# Patient Record
Sex: Female | Born: 1990 | Hispanic: Yes | Marital: Married | Smoking: Never smoker
Health system: Southern US, Community
[De-identification: ages and names within clinical notes are randomized; demographics above are authoritative.]

## PROBLEM LIST (undated history)

## (undated) ENCOUNTER — Inpatient Hospital Stay (HOSPITAL_COMMUNITY): Payer: Self-pay

## (undated) DIAGNOSIS — Z8669 Personal history of other diseases of the nervous system and sense organs: Secondary | ICD-10-CM

## (undated) DIAGNOSIS — R569 Unspecified convulsions: Secondary | ICD-10-CM

## (undated) DIAGNOSIS — K200 Eosinophilic esophagitis: Secondary | ICD-10-CM

## (undated) DIAGNOSIS — IMO0002 Reserved for concepts with insufficient information to code with codable children: Secondary | ICD-10-CM

## (undated) DIAGNOSIS — T7840XA Allergy, unspecified, initial encounter: Secondary | ICD-10-CM

## (undated) DIAGNOSIS — F419 Anxiety disorder, unspecified: Secondary | ICD-10-CM

## (undated) DIAGNOSIS — N3010 Interstitial cystitis (chronic) without hematuria: Secondary | ICD-10-CM

## (undated) DIAGNOSIS — D509 Iron deficiency anemia, unspecified: Secondary | ICD-10-CM

## (undated) DIAGNOSIS — N189 Chronic kidney disease, unspecified: Secondary | ICD-10-CM

## (undated) HISTORY — DX: Reserved for concepts with insufficient information to code with codable children: IMO0002

## (undated) HISTORY — DX: Anxiety disorder, unspecified: F41.9

## (undated) HISTORY — DX: Allergy, unspecified, initial encounter: T78.40XA

## (undated) HISTORY — DX: Eosinophilic esophagitis: K20.0

## (undated) HISTORY — PX: CYSTOSCOPY W/ DILATION OF BLADDER: SUR374

## (undated) HISTORY — PX: OTHER SURGICAL HISTORY: SHX169

## (undated) HISTORY — DX: Personal history of other diseases of the nervous system and sense organs: Z86.69

## (undated) HISTORY — DX: Iron deficiency anemia, unspecified: D50.9

## (undated) HISTORY — DX: Interstitial cystitis (chronic) without hematuria: N30.10

---
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
## 2009-03-10 ENCOUNTER — Telehealth: Payer: Self-pay | Admitting: Family Medicine

## 2009-03-10 IMAGING — US US OB DETAIL+14 WK
1 series · 14 of 28 positions shown · non-contrast
Comparison: none

OBSTETRICAL ULTRASOUND:
 This ultrasound exam was performed in the [HOSPITAL] Ultrasound Department.  The OB US report was generated in the AS system, and faxed to the ordering physician.  This report is also available in [REDACTED] PACS.

[Series 1: us ob detail+14 wk · 0.17mm/px · 14 of 85 slices shown]
[im 4/85]
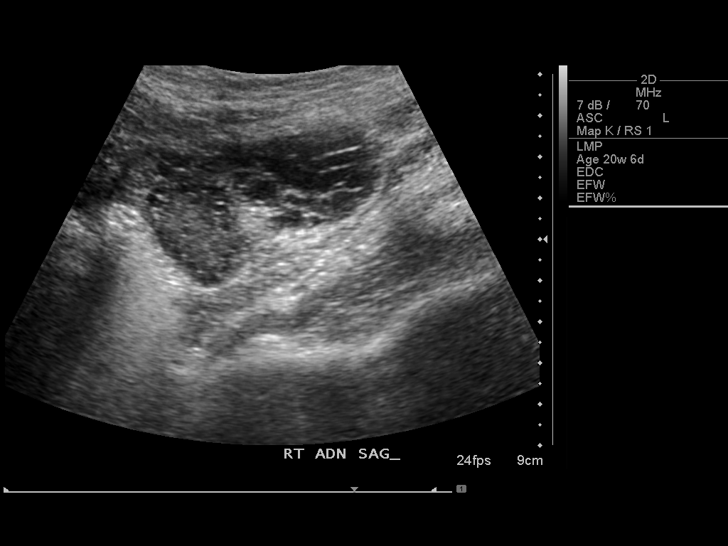
[im 10/85]
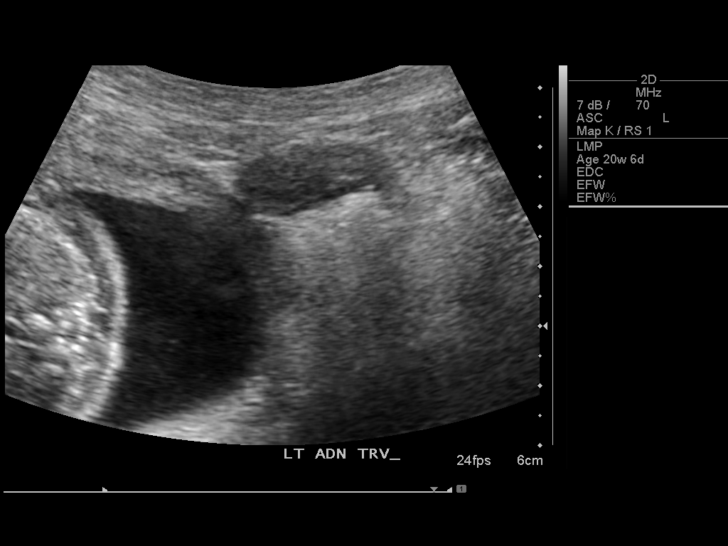
[im 16/85]
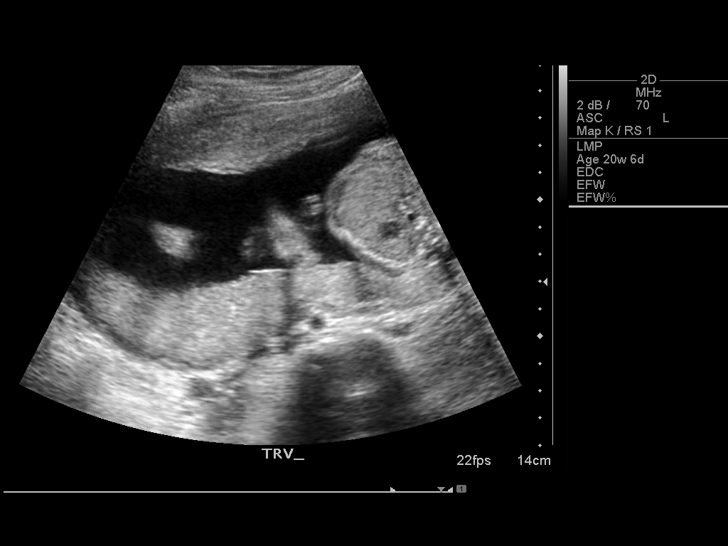
[im 22/85]
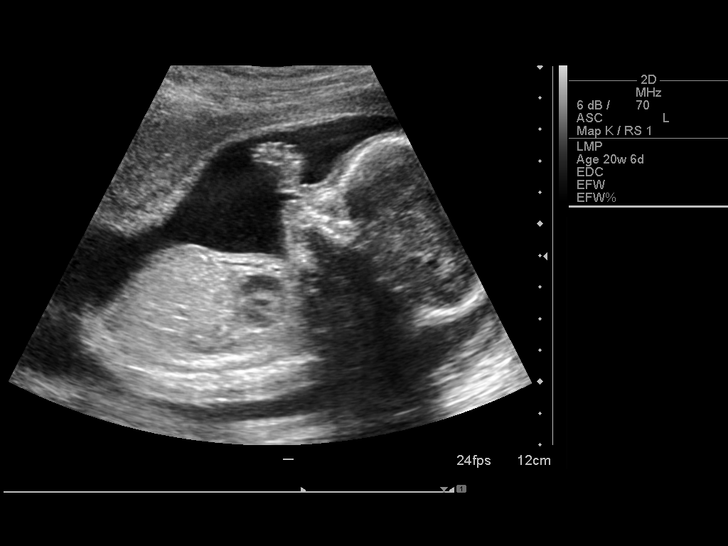
[im 29/85]
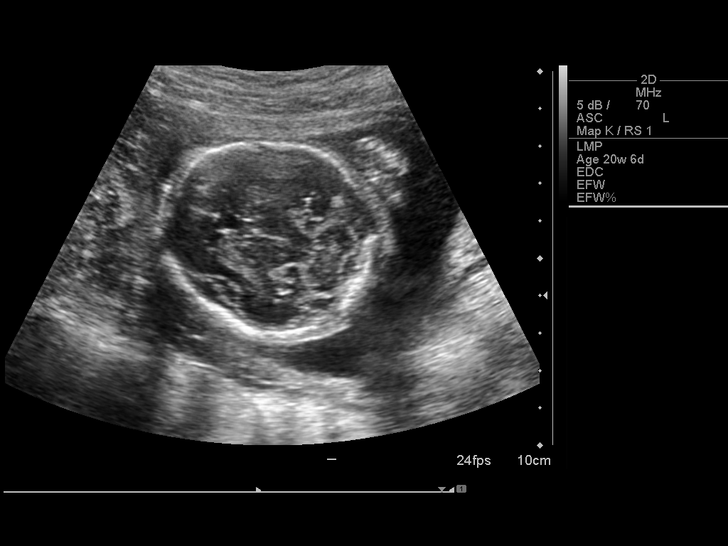
[im 35/85]
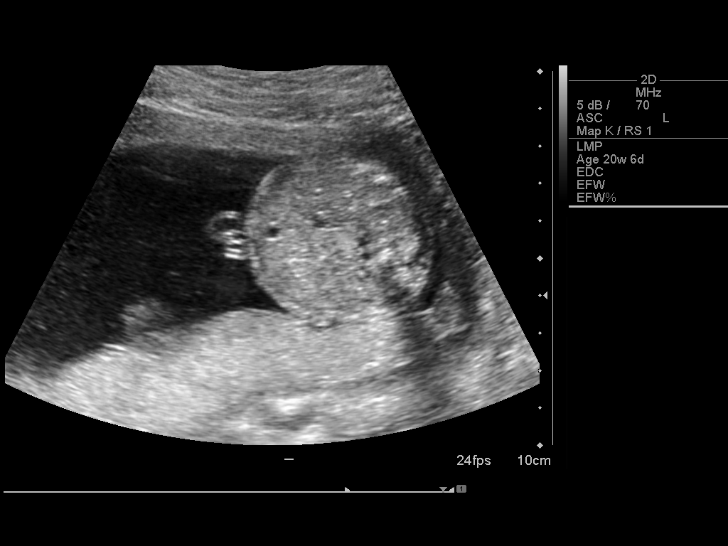
[im 41/85]
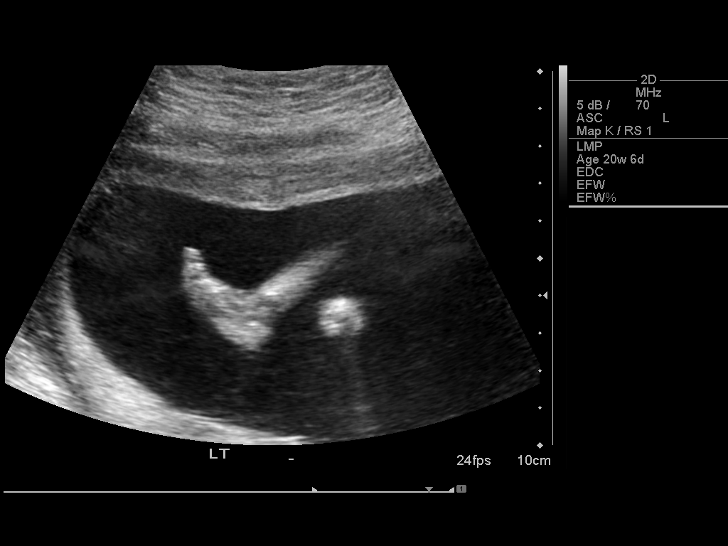
[im 47/85]
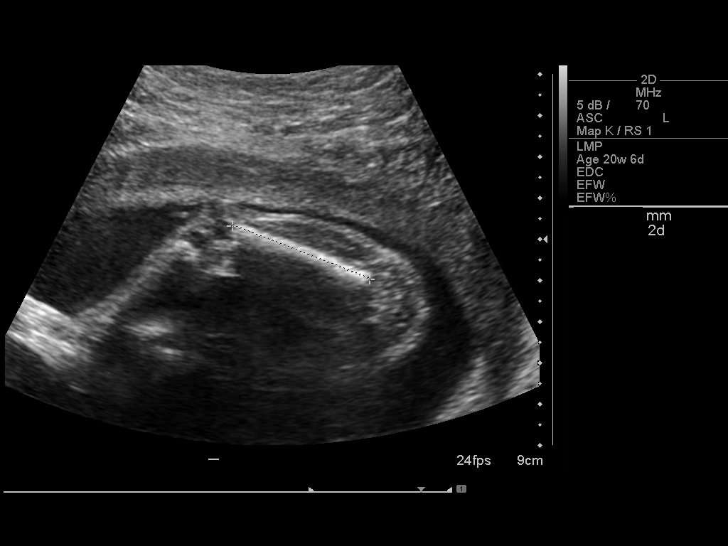
[im 53/85]
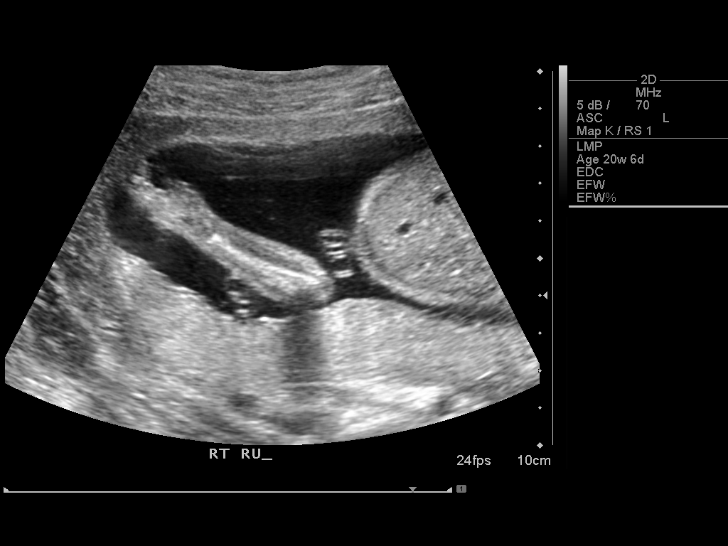
[im 60/85]
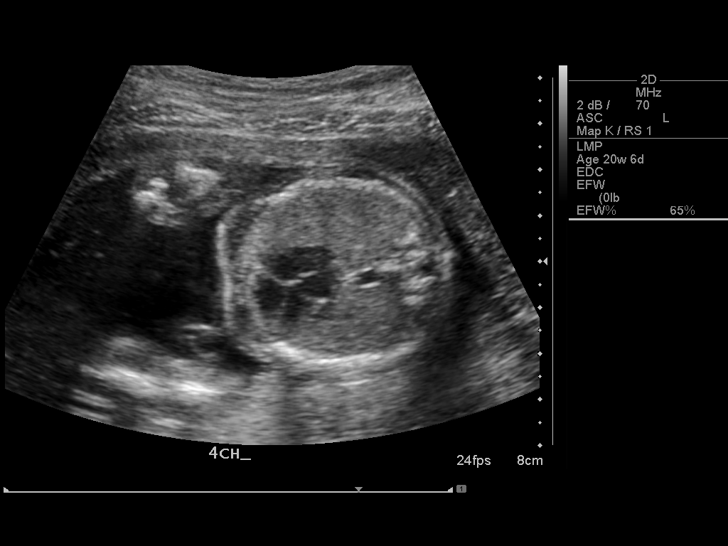
[im 66/85]
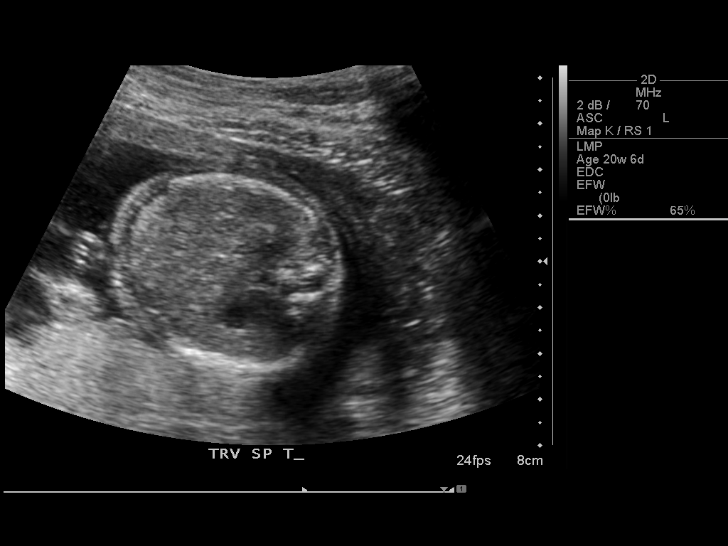
[im 72/85]
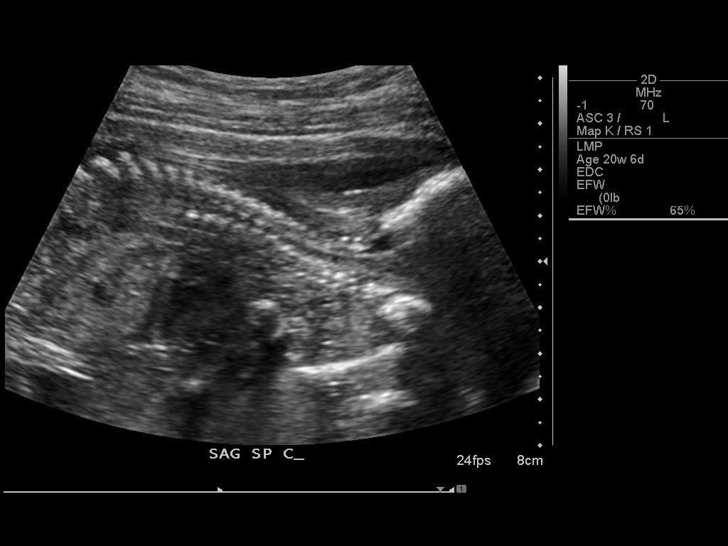
[im 78/85]
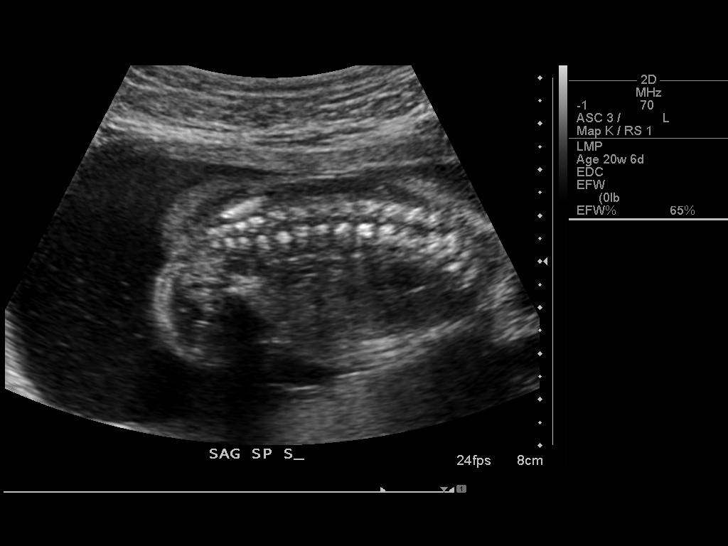
[im 85/85]
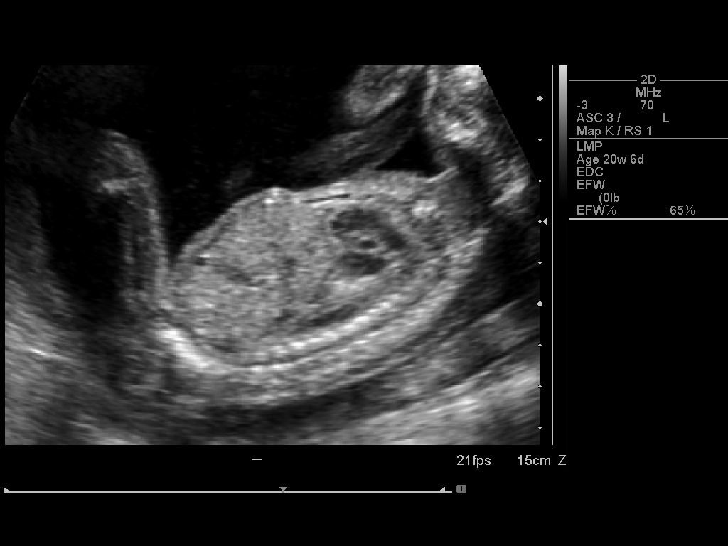

[14 of 28 positions shown; findings below may reference images not displayed]

IMPRESSION: See AS Obstetric US report.

## 2009-03-14 ENCOUNTER — Encounter: Payer: Self-pay | Admitting: Family Medicine

## 2009-03-14 ENCOUNTER — Ambulatory Visit: Payer: Self-pay | Admitting: Family Medicine

## 2009-03-14 DIAGNOSIS — R109 Unspecified abdominal pain: Secondary | ICD-10-CM | POA: Insufficient documentation

## 2009-03-14 LAB — CONVERTED CEMR LAB
Bilirubin Urine: NEGATIVE
Chlamydia, DNA Probe: NEGATIVE
Glucose, Urine, Semiquant: NEGATIVE
Ketones, urine, test strip: NEGATIVE
pH: 6

## 2009-03-15 ENCOUNTER — Encounter: Payer: Self-pay | Admitting: Family Medicine

## 2009-03-17 ENCOUNTER — Telehealth: Payer: Self-pay | Admitting: Family Medicine

## 2009-09-30 ENCOUNTER — Encounter: Payer: Self-pay | Admitting: Family Medicine

## 2009-09-30 ENCOUNTER — Ambulatory Visit: Payer: Self-pay | Admitting: Family Medicine

## 2009-09-30 DIAGNOSIS — K5900 Constipation, unspecified: Secondary | ICD-10-CM | POA: Insufficient documentation

## 2009-09-30 LAB — CONVERTED CEMR LAB
MCHC: 32.9 g/dL (ref 30.0–36.0)
MCV: 91.6 fL (ref 78.0–100.0)
RBC: 3.95 M/uL (ref 3.87–5.11)
RDW: 12.7 % (ref 11.5–15.5)
TSH: 0.463 microintl units/mL (ref 0.350–4.500)

## 2009-10-04 ENCOUNTER — Encounter: Payer: Self-pay | Admitting: Family Medicine

## 2009-10-13 ENCOUNTER — Ambulatory Visit: Payer: Self-pay | Admitting: Family Medicine

## 2009-12-05 ENCOUNTER — Encounter: Payer: Self-pay | Admitting: Family Medicine

## 2009-12-05 ENCOUNTER — Ambulatory Visit: Payer: Self-pay | Admitting: Family Medicine

## 2009-12-05 DIAGNOSIS — R300 Dysuria: Secondary | ICD-10-CM | POA: Insufficient documentation

## 2009-12-05 DIAGNOSIS — N390 Urinary tract infection, site not specified: Secondary | ICD-10-CM | POA: Insufficient documentation

## 2009-12-05 LAB — CONVERTED CEMR LAB
Bilirubin Urine: NEGATIVE
Nitrite: NEGATIVE
Protein, U semiquant: NEGATIVE
Urobilinogen, UA: 0.2

## 2010-02-12 ENCOUNTER — Telehealth: Payer: Self-pay | Admitting: Family Medicine

## 2010-02-13 ENCOUNTER — Other Ambulatory Visit: Admission: RE | Admit: 2010-02-13 | Discharge: 2010-02-13 | Payer: Self-pay | Admitting: Family Medicine

## 2010-02-13 ENCOUNTER — Ambulatory Visit: Payer: Self-pay | Admitting: Family Medicine

## 2010-02-13 ENCOUNTER — Encounter: Payer: Self-pay | Admitting: Family Medicine

## 2010-02-13 DIAGNOSIS — B3731 Acute candidiasis of vulva and vagina: Secondary | ICD-10-CM | POA: Insufficient documentation

## 2010-02-13 DIAGNOSIS — B373 Candidiasis of vulva and vagina: Secondary | ICD-10-CM | POA: Insufficient documentation

## 2010-02-13 DIAGNOSIS — R4589 Other symptoms and signs involving emotional state: Secondary | ICD-10-CM | POA: Insufficient documentation

## 2010-02-13 LAB — CONVERTED CEMR LAB
Bilirubin Urine: NEGATIVE
Nitrite: NEGATIVE
Urobilinogen, UA: 0.2
Whiff Test: POSITIVE

## 2010-02-14 ENCOUNTER — Encounter: Payer: Self-pay | Admitting: Family Medicine

## 2010-02-17 ENCOUNTER — Encounter: Payer: Self-pay | Admitting: Family Medicine

## 2010-03-03 ENCOUNTER — Telehealth: Payer: Self-pay | Admitting: *Deleted

## 2010-03-04 ENCOUNTER — Telehealth: Payer: Self-pay | Admitting: *Deleted

## 2010-03-06 ENCOUNTER — Telehealth: Payer: Self-pay | Admitting: Family Medicine

## 2010-03-20 ENCOUNTER — Encounter: Payer: Self-pay | Admitting: Family Medicine

## 2010-03-20 ENCOUNTER — Ambulatory Visit: Payer: Self-pay | Admitting: Family Medicine

## 2010-03-20 DIAGNOSIS — R634 Abnormal weight loss: Secondary | ICD-10-CM | POA: Insufficient documentation

## 2010-03-20 LAB — CONVERTED CEMR LAB: TSH: 1.186 microintl units/mL (ref 0.350–4.500)

## 2010-04-06 ENCOUNTER — Ambulatory Visit: Payer: Self-pay | Admitting: Family Medicine

## 2010-04-09 ENCOUNTER — Encounter: Payer: Self-pay | Admitting: Family Medicine

## 2010-04-10 ENCOUNTER — Encounter: Payer: Self-pay | Admitting: Family Medicine

## 2010-06-02 NOTE — Progress Notes (Signed)
Summary: meds prob  Phone Note Call from Patient Call back at [phone]   Caller: Patient Summary of Call: pt is in [place] and thinks she has a yeast inf due to meds from last week. can something be called in [facility] [place] Initial call taken by: De Nurse,  March 03, 2010 1:33 PM  Follow-up for Phone Call        Told pt we would not call anything in unless she is seen.  She will be in [place] until Saturday.  Recommended that she either go to a local [facility] or attempt to treat with OTCs and if not better by the time she gets back we would see her.  Pt agreeable. Follow-up by: Dennison Nancy RN,  March 03, 2010 1:59 PM

## 2010-06-02 NOTE — Assessment & Plan Note (Signed)
Summary: pt having unusual d/c from bowel movements/[facility]/bmc   Vital Signs:  Patient profile:   20 year old female Height:      60.5 inches Weight:      92.6 pounds BMI:     17.85 Temp:     98.0 degrees F oral Pulse rate:   94 / minute BP sitting:   123 / 75  (left arm) Cuff size:   regular  Vitals Entered By: Garen Grams LPN (March 20, 2010 9:38 AM) CC: sore throat with itchy rash all over x 1week Is Patient Diabetic? No Pain Assessment Patient in pain? yes     Location: throat   Primary Nayanna Seaborn:  Alvia Grove DO  CC:  sore throat with itchy rash all over x 1week.  History of Present Illness: 1. pt presents with complaint of a rash for one week that initially started on her lower extremity and progressed to her upper extremity and is now more diffuse and pruritic.  she has tried hydrocortisone cream for relief as well as zyrtec.  during this time she also noticed a sore throat for the past 4 days associated with a dry non productive cough without fever, chills, nausea or vomiting.  she denies any sick contacts, exposures, change in medication, and immunizations are up to date.  pt has not had her flu vaccination for this season as of yet and she declines flu vaccination.  2.as an aside, pt complain of a stool abnormality for the past 1-2 weeks.  she states it is not diarrhea but states she has a white yellow discharge prior to bowel movement without hematochezia.    3. pt also notes unexplained weight loss of about 5-10 pounds since august despite trying to gain weight.  she does note some anxiety, however, she contributes this to recent life stressors.    Preventive Screening-Counseling & Management  Alcohol-Tobacco     Alcohol drinks/day: 0     Smoking Status: never     Smoking Cessation Counseling: yes     Packs/Day: n/a  Allergies: No Known Drug Allergies  Physical Exam  General:  Well-developed,well-nourished,in no acute distress; alert,appropriate and  cooperative throughout examination Head:  Normocephalic and atraumatic without obvious abnormalities. . Ears:  External ear exam shows no significant lesions or deformities.  Otoscopic examination reveals clear canals, tympanic membranes are intact bilaterally without bulging, retraction, inflammation or discharge. Hearing is grossly normal bilaterally. Nose:  mild turbinate hypertrophy and erythema with clear discharge Mouth:  pharyngeal erythema with tonsillar hypertrophy and mild white exudates, tongue without erythema Neck:  posterior lymphadenopathy, soft, mobile, nontender Lungs:  Normal respiratory effort, chest expands symmetrically. Lungs are clear to auscultation, no crackles or wheezes. Heart:  Normal rate and regular rhythm. S1 and S2 normal without gallop, murmur, click, rub or other extra sounds. Abdomen:  Bowel sounds positive,abdomen soft and non-tender without masses, organomegaly or hernias noted. Skin:  mild erythematous macular rash on left forearm, left knee, no palpable lesions   Impression & Recommendations:  Problem # 1:  NONSPECIFIC ABNORMAL FINDING IN STOOL CONTENTS (ICD-792.1) Assessment New  Orders: [facility]- Est  Level 4 (99214)Future Orders: Stool Giardia/Cryptosporidium-[facility] ([id number]) ... 03/19/2011 Stool, WBC/Lactoferrin-[facility] (60737) ... 03/19/2011 Culture, Stool- [facility] [phone]) ... 03/19/2011 continue observation.  unclear as to etiology at this time.  will have pt follow up results at next visit  Problem # 2:  LOSS OF WEIGHT (OEV-035.00) Assessment: New  Orders: TSH-[facility] ([id number]) [facility]- Est  Level 4 (16967) pt may need nutritional  consult and evaluation if weight loss continues.  will follow up results.   Problem # 3:  ACUTE PHARYNGITIS (ICD-462) Assessment: Unchanged  Her updated medication list for this problem includes:    Augmentin 875-125 Mg Tabs (Amoxicillin-pot clavulanate) ..... One tab by mouth two times a day for 10 days for  pharyngitis although rapid strep was negative, clinically pt likely has strep pharyngitis.  ?scarlet fever however, this rash is not diffuse and there are not any palpable lesions.  nonetheless, will treat for strep and re evaluate rash.  pt cautioned regarding possible worsening of the rash with the medication.  Orders: [facility]- Est  Level 4 (29562)  Complete Medication List: 1)  Augmentin 875-125 Mg Tabs (Amoxicillin-pot clavulanate) .... One tab by mouth two times a day for 10 days for pharyngitis  Other Orders: Rapid Strep-[facility] (13086)  Patient Instructions: 1)  It was a pleasure taking care of you today 2)  Please schedule a follow-up appointment in 2 weeks for pharyngitis and results. 3)  start antibiotcs for throat infection.  4)  If you notice the rash worsening with the medication, Stop immediately and call the office.  if you experience any difficulty breathing, or any other concerning symptom, go to the emergency room.  Prescriptions: AUGMENTIN 875-125 MG TABS (AMOXICILLIN-POT CLAVULANATE) one tab by mouth two times a day for 10 days for pharyngitis  #20 x 0   Entered and Authorized by:   Maryelizabeth Kaufmann MD   Signed by:   Maryelizabeth Kaufmann MD on 03/20/2010   Method used:   [facility] [phone]* (retail)       [address]       [place], [place]  [address]       Ph: [phone] or [phone]       Fax: [phone]   RxID:   [id number]    Orders Added: 1)  Rapid Strep-[facility] [87430] 2)  TSH-[facility] [[id number]] 3)  Stool Giardia/Cryptosporidium-[facility] [[id number]] 4)  Stool, WBC/Lactoferrin-[facility] [83630] 5)  Culture, Stool- [facility] [[id number]] 6)  [facility]- Est  Level 4 [23762]    Laboratory Results  Date/Time Received: March 20, 2010 9:42 AM  Date/Time Reported: March 20, 2010 10:00 AM   Other Tests  Rapid Strep: negative Comments: ...............test performed by......Marland KitchenBonnie A. [place], MLS (ASCP)cm

## 2010-06-02 NOTE — Letter (Signed)
Summary: Letter  Letter   Imported By: Clydell Hakim 02/17/2010 12:19:21  _____________________________________________________________________  External Attachment:    Type:   Image     Comment:   External Document
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
## 2010-06-02 NOTE — Miscellaneous (Signed)
Summary: Orders Update  Clinical Lists Changes  Orders: Added new Test order of [facility]- Est Level  3 (99213) - Signed  

## 2010-06-02 NOTE — Progress Notes (Signed)
Summary: med update  Phone Note Call from Patient   Caller: Patient Call For: [phone] Summary of Call: Think she may have a yeast infection.  Want to be seen today Initial call taken by: Abundio Miu,  March 06, 2010 3:00 PM  Follow-up for Phone Call        spoke with patient and she has recently taken sulfameth and metronidazole for  UTI and  bactrial vaginal infection. now she has vaginal itching. no discharge. will ask MD if she will send in rx for yeast . pharmacy [facility] [place]. Follow-up by: Theresia Lo RN,  March 06, 2010 3:06 PM    New/Updated Medications: DIFLUCAN 150 MG TABS (FLUCONAZOLE) 1 pill by mouth one time for yeast infection Prescriptions: DIFLUCAN 150 MG TABS (FLUCONAZOLE) 1 pill by mouth one time for yeast infection  #1 x 1   Entered and Authorized by:   Alvia Grove DO   Signed by:   Alvia Grove DO on 03/06/2010   Method used:   [facility]        [facility] [phone]* (retail)       [address]       [place], [place]  [address]       Ph: [phone] or [phone]       Fax: [phone]   RxID:   [id number]   Appended Document: med update patient notified.

## 2010-06-02 NOTE — Assessment & Plan Note (Signed)
Summary: uti   Vital Signs:  Patient profile:   20 year old female Height:      60.5 inches Weight:      97 pounds BMI:     18.70 Temp:     98.5 degrees F oral Pulse rate:   71 / minute BP sitting:   116 / 74  (right arm) Cuff size:   regular  Vitals Entered By: Tessie Fass CMA (December 05, 2009 1:58 PM) CC: dysuria x 10 days Pain Assessment Patient in pain? no        Primary Care Provider:  Alvia Grove DO  CC:  dysuria x 10 days.  History of Present Illness: urinary frequency, retention, dysuria: x 10 days.  buring with urination, + nausea, no fever. no diarrhea.  no vomiting. Pt endorses frequent uti's.  not sexually active currently.  Current Medications (verified): 1)  Bactrim Ds 800-160 Mg Tabs (Sulfamethoxazole-Trimethoprim) .... Take 1 Tablet By Mouth Two Times A Day For 3 Days  Allergies (verified): No Known Drug Allergies  Review of Systems       as per hpi  Physical Exam  General:  VSS Well-developed,well-nourished,in no acute distress; alert,appropriate and cooperative throughout examination Lungs:  Normal respiratory effort, chest expands symmetrically. Lungs are clear to auscultation, no crackles or wheezes. Abdomen:  Bowel sounds positive,abdomen soft and non-tender without masses, organomegaly or hernias noted.   Some mild discomfort with palpation of suprapubic area. Extremities:  no edema   Impression & Recommendations:  Problem # 1:  UTI (ICD-599.0) u/a not clearly positive.  appears to have some contamination since large number of epithelial cells.  Yet, clinically symptoms appear to be suggesting uti.  Since pt symptomatic will treat with bactrim.  Pt given red flags to return.    Discussed this plan with Dr. Mauricio Po.  Her updated medication list for this problem includes:    Bactrim Ds 800-160 Mg Tabs (Sulfamethoxazole-trimethoprim) .Marland Kitchen... Take 1 tablet by mouth two times a day for 3 days  Complete Medication List: 1)  Bactrim Ds  800-160 Mg Tabs (Sulfamethoxazole-trimethoprim) .... Take 1 tablet by mouth two times a day for 3 days  Other Orders: Urinalysis-[facility] (00000)  Patient Instructions: 1)  take medicine as indicated. 2)  return if symptoms do not improve with the antibiotics or if any new or worsening symptoms. Prescriptions: BACTRIM DS 800-160 MG TABS (SULFAMETHOXAZOLE-TRIMETHOPRIM) take 1 tablet by mouth two times a day for 3 days  #6 x 0   Entered and Authorized by:   Ellin Mayhew MD   Signed by:   Ellin Mayhew MD on 12/05/2009   Method used:   [facility] to        [facility] [phone]* (retail)       [address]       [place], [place]  [address]       Ph: [phone] or [phone]       Fax: [phone]   RxID:   [id number]   Laboratory Results   Urine Tests  Date/Time Received: December 05, 2009 2:03 PM  Date/Time Reported: December 05, 2009 2:20 PM   Routine Urinalysis   Color: yellow Appearance: slightly Cloudy Glucose: negative   (Normal Range: Negative) Bilirubin: negative   (Normal Range: Negative) Ketone: negative   (Normal Range: Negative) Spec. Gravity: 1.020   (Normal Range: 1.003-1.035) Blood: negative   (Normal Range: Negative) pH: 6.5   (Normal Range: 5.0-8.0) Protein: negative   (Normal Range: Negative) Urobilinogen:  0.2   (Normal Range: 0-1) Nitrite: negative   (Normal Range: Negative) Leukocyte Esterace: small   (Normal Range: Negative)  Urine Microscopic WBC/HPF: 15-25 Bacteria/HPF: 2+ Mucous/HPF: 2+ Epithelial/HPF: 15-25    Comments: ...............test performed by......Marland KitchenBonnie A. [place], MLS (ASCP)cm

## 2010-06-02 NOTE — Assessment & Plan Note (Signed)
Summary: fever/sore throat/[place] pt/eo   Vital Signs:  Patient profile:   20 year old female Height:      60.5 inches Weight:      97.3 pounds BMI:     18.76 Temp:     101.9 degrees F oral Pulse rate:   123 / minute BP sitting:   111 / 68  (left arm) Cuff size:   regular  Vitals Entered By: Gladstone Pih (October 13, 2009 10:43 AM) CC: C/O fever, sore throat,nause Is Patient Diabetic? No   Primary Care Provider:  Alvia Grove DO  CC:  C/O fever, sore throat, and nause.  History of Present Illness: complaining of three days of HA then since friday fatigue,  HA, myalgias, fever.  has some nausea and diffuse belly pain.  vomitted x 1 this am.  not eating, only drinking OJ.  has taken comtrex for fever (contains tylenol)  Habits & Providers  Alcohol-Tobacco-Diet     Tobacco Status: never  Current Medications (verified): 1)  Colace 100 Mg Caps (Docusate Sodium) .Marland Kitchen.. 1 Pill By Mouth Two Times A Day As Needed Constipation  Allergies (verified): No Known Drug Allergies  Review of Systems       The patient complains of anorexia, fever, headaches, and abdominal pain.  The patient denies prolonged cough.    Physical Exam  General:  tired appearing Head:  Normocephalic and atraumatic without obvious abnormalities. No apparent alopecia or balding. Eyes:  No corneal or conjunctival inflammation noted. EOMI. Perrla. Funduscopic exam benign, without hemorrhages, exudates or papilledema. Vision grossly normal. Mouth:  pharyngeal erythema.   Neck:  diffuse lymphadenopathy through cervical chain Lungs:  Normal respiratory effort, chest expands symmetrically. Lungs are clear to auscultation, no crackles or wheezes. Heart:  Normal rate and regular rhythm. S1 and S2 normal without gallop, murmur, click, rub or other extra sounds. Abdomen:  no spleen enlargement.  mild tenderness   Impression & Recommendations:  Problem # 1:  MONONUCLEOSIS (ICD-075) Assessment New positive monospot.   asked to RTC in 6 weeks or sooner with concerns.  advised against contact sports, to limit kissing her baby on the face.  advised tylenol for fever, lots of fluids Orders: [facility]- Est Level  3 (99213)  Complete Medication List: 1)  Colace 100 Mg Caps (Docusate sodium) .Marland Kitchen.. 1 pill by mouth two times a day as needed constipation  Other Orders: Rapid Strep-[facility] (10272) Monospot-[facility] (53664)  Patient Instructions: 1)  It was nice to meet you today.  I am sorry that you are not feeling well. 2)  TOday we checked for strep throat and that was negative.  We also checked for mono.   3)  For the fever, you can take 650mg  of tylenol every 4 hours.  THat is two regular strength.  You could alternate motrin and tylenol every 4 hours if you prefer, but you need to be careful to remember which one you last took. 4)  I would not be surprised if others in your family get sick.  If you get concerned, especially for your son, bring him in to the clinic.   5)  The most important thing is to stay hydrated.  Drink small sips throughout the day.  Gatorade, water or juice are all fine.   Medication Administration  Medication # 1:    Medication: Tylenol 500 mg tab    Diagnosis: SORE THROAT (ICD-462)    Dose: 2 tablets    Route: po    Exp Date: 04/21/2011  Lot #: [id number]    Mfr: Major    Comments: given 2  for fever per Dr Tora Kindred    Patient tolerated medication without complications    Given by: Gladstone Pih (October 13, 2009 10:57 AM)  Orders Added: 1)  Rapid Strep-[facility] [87430] 2)  Monospot-[facility] [86308] 3)  [facility]- Est Level  3 [99213]  Laboratory Results   Blood Tests   Date/Time Received: October 13, 2009 11:34 AM  Date/Time Reported: October 13, 2009 12:13 PM    Mono: positive Comments: ...........test performed by...........Marland KitchenTerese Door, CMA  Date/Time Received: October 13, 2009 10:51 AM  Date/Time Reported: October 13, 2009 11:07 AM   Other Tests  Rapid Strep: negative Comments: ...........test  performed by...........Marland KitchenTerese Door, CMA
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
## 2010-06-02 NOTE — Progress Notes (Signed)
Summary: triage   Phone Note Call from Patient Call back at Home Phone [phone]   Caller: Patient Summary of Call: Pt has had discharge for 2 weeks and is asking to be seen today. Initial call taken by: Clydell Hakim,  February 12, 2010 10:47 AM  Follow-up for Phone Call        clear to yellow discharge. denies itching or burning. "smells" declined UC. appt with pcp at 1:30 friday  Follow-up by: Golden Circle RN,  February 12, 2010 10:58 AM     Noted

## 2010-06-02 NOTE — Letter (Signed)
Summary: Generic Letter  [facility]  [address]   [place], [place] [address]   Phone: [phone]  Fax: [phone]    10/04/2009  Catherine Hamilton [address] [place], [place]  [address]  Dear Catherine Hamilton,   I have reviewed your labs fram May 31st and everything looks normal.  Please call the office if you have questions.      Sincerely,   Alvia Grove DO  Appended Document: Generic Letter mailed letter to pt

## 2010-06-02 NOTE — Progress Notes (Signed)
Summary: triage  Phone Note Call from Patient Call back at [phone]   Caller: Patient Summary of Call: pt wants to come in today for yeast inf Initial call taken by: De Nurse,  March 04, 2010 10:55 AM  Follow-up for Phone Call        Gave her a [place] appt for this afternoon. Follow-up by: Dennison Nancy RN,  March 04, 2010 11:17 AM

## 2010-06-14 ENCOUNTER — Encounter: Payer: Self-pay | Admitting: *Deleted

## 2010-07-21 ENCOUNTER — Encounter: Payer: Self-pay | Admitting: Family Medicine

## 2010-08-03 ENCOUNTER — Ambulatory Visit: Payer: Self-pay | Admitting: Family Medicine

## 2010-10-27 ENCOUNTER — Telehealth: Payer: Self-pay | Admitting: Family Medicine

## 2010-10-27 NOTE — Telephone Encounter (Signed)
Patient called back.  She is in a monogamous relationship with her partner.  She last has sex 2 weeks ago (he used a condom) and a couple of days afterwards she developed a vaginal discharge.  States that it has an odor and is yellow/green in color.  Advised her to come in for a vaginal swab but she refused.  Says that she does not have insurnace and cannot afford to pay for the visit out of pocket.  Asked her of she is also having any vaginal itching and burning.  She said yes so advised her to try an OTC antifungal vaginal cream to see if that helped.  If that did not help told her she could come in and pay what she could afford and she should apply for financial assistance.  She currently does not work.  Patient agreeable.

## 2010-10-27 NOTE — Telephone Encounter (Signed)
Asking to speak with RN about discharge she is having, just wants to know if it is normal?

## 2010-10-27 NOTE — Telephone Encounter (Signed)
Called patient back but she had company and could not talk.  I gave her my direct number and she will call me back.

## 2010-11-03 ENCOUNTER — Ambulatory Visit: Payer: Self-pay

## 2010-11-03 ENCOUNTER — Ambulatory Visit: Payer: Self-pay | Admitting: Family Medicine

## 2010-11-06 ENCOUNTER — Ambulatory Visit (INDEPENDENT_AMBULATORY_CARE_PROVIDER_SITE_OTHER): Payer: Self-pay | Admitting: Family Medicine

## 2010-11-06 VITALS — BP 117/78 | HR 80 | Temp 99.2°F | Wt 104.5 lb

## 2010-11-06 DIAGNOSIS — N898 Other specified noninflammatory disorders of vagina: Secondary | ICD-10-CM | POA: Insufficient documentation

## 2010-11-06 DIAGNOSIS — N760 Acute vaginitis: Secondary | ICD-10-CM

## 2010-11-06 DIAGNOSIS — Z7251 High risk heterosexual behavior: Secondary | ICD-10-CM

## 2010-11-06 DIAGNOSIS — N390 Urinary tract infection, site not specified: Secondary | ICD-10-CM

## 2010-11-06 DIAGNOSIS — Z975 Presence of (intrauterine) contraceptive device: Secondary | ICD-10-CM

## 2010-11-06 DIAGNOSIS — Z309 Encounter for contraceptive management, unspecified: Secondary | ICD-10-CM | POA: Insufficient documentation

## 2010-11-06 DIAGNOSIS — Z30431 Encounter for routine checking of intrauterine contraceptive device: Secondary | ICD-10-CM | POA: Insufficient documentation

## 2010-11-06 DIAGNOSIS — IMO0001 Reserved for inherently not codable concepts without codable children: Secondary | ICD-10-CM | POA: Insufficient documentation

## 2010-11-06 DIAGNOSIS — R350 Frequency of micturition: Secondary | ICD-10-CM

## 2010-11-06 DIAGNOSIS — R300 Dysuria: Secondary | ICD-10-CM

## 2010-11-06 LAB — POCT URINALYSIS DIPSTICK
Bilirubin, UA: NEGATIVE
Blood, UA: NEGATIVE
Leukocytes, UA: NEGATIVE
Nitrite, UA: POSITIVE
Urobilinogen, UA: 0.2
pH, UA: 5.5

## 2010-11-06 LAB — POCT URINE PREGNANCY: Preg Test, Ur: NEGATIVE

## 2010-11-06 MED ORDER — METRONIDAZOLE 500 MG PO TABS: 500.0000 mg | ORAL_TABLET | Freq: Two times a day (BID) | ORAL | Status: AC

## 2010-11-06 NOTE — Assessment & Plan Note (Signed)
Discussed pros and cons of IUD versus other contraception.  Pt states she will think about this and return for f/up appt if she decides to change form of birth control.  Pt encouraged to use condoms.

## 2010-11-06 NOTE — Progress Notes (Signed)
  Subjective:    Patient ID: Catherine Hamilton, female    DOB: [birth year], 20 y.o.   MRN: [id number]  HPI Vaginal discharge: X 1 month. Used vagisil OTC with no improvement.  Dark yellow in color.  + foul odor- fishy odor.  No itching.  Last sexual encounter 1 month ago.  Doesn't use condoms when sexually active.  Urinary frequency:  No dysuria.  Some mild increase in frequency.  Possible mild retention off and on  x a few days.   IUD: Doesn't routinely feel for strings of IUD.  Has not had any problems with IUD except for concern about not having a menstrual cycle.   Contraception: Is not sure that she wants to keep this.  Is considering having it removed.  She is concerned b/c she isn't having a period any longer and she feels that this is "unnatural"     Review of Systems As per above, no fever. No rash.  No abd pain.  No vaginal bleeding.    Objective:   Physical Exam  Constitutional: She is oriented to person, place, and time. She appears well-developed and well-nourished.  Abdominal: Soft. She exhibits no distension. There is no tenderness. There is no rebound and no guarding.  Genitourinary: There is no rash on the right labia. There is no rash on the left labia. Cervix exhibits no motion tenderness, no discharge and no friability. Right adnexum displays no mass and no tenderness. Left adnexum displays no mass and no tenderness. Vaginal discharge found.         Moderate yellow/cream vaginal discharge  Musculoskeletal: Normal range of motion. She exhibits no edema.  Neurological: She is alert and oriented to person, place, and time.  Skin: No rash noted.  Psychiatric: She has a normal mood and affect. Her behavior is normal. Judgment and thought content normal.          Assessment & Plan:

## 2010-11-06 NOTE — Patient Instructions (Signed)
You may have bacterial vaginosis--Take flagyl as directed.  I will send the results of STD testing in letter in the mail. Consider returning for HIV and syphilis screening. We will schedule an ultrasound appointment to check placement of IUD.

## 2010-11-06 NOTE — Assessment & Plan Note (Signed)
History and physical suggest BV.  Will treat with flagyl.  GC/Chlam obtained and sent to lab.  Pt to return if no improvement with flagyl.

## 2010-11-06 NOTE — Assessment & Plan Note (Signed)
Can not feel or see string for IUD.  Order placed for a u/s to evaluate placement of iud.

## 2010-11-06 NOTE — Assessment & Plan Note (Signed)
U/a neg except for postive nitrities.  No dysuria.  Some mild symptoms that could suggest uti.  Will send off urine culture.  Pt to return if new or worsening of symtoms.

## 2010-11-09 ENCOUNTER — Telehealth: Payer: Self-pay | Admitting: Family Medicine

## 2010-11-09 ENCOUNTER — Telehealth: Payer: Self-pay | Admitting: *Deleted

## 2010-11-09 ENCOUNTER — Other Ambulatory Visit: Payer: Self-pay | Admitting: Family Medicine

## 2010-11-09 LAB — URINE CULTURE

## 2010-11-09 MED ORDER — CEPHALEXIN 500 MG PO CAPS: 500.0000 mg | ORAL_CAPSULE | Freq: Two times a day (BID) | ORAL | Status: AC

## 2010-11-09 NOTE — Telephone Encounter (Signed)
Informed pt of pos ua cx and meds at pharmacy. .. See dr.caviness notes .Arlyss Repress

## 2010-11-09 NOTE — Telephone Encounter (Signed)
Call placed to pt telephone.  No answer.  Message left requesting pt to return call.  Pt needs to be notified of + urine culture and that antibiotic has been sent to [facility] pharmacy.  [facility] [address].  Nursing staff can notify pt of this when she returns call or if pt will leave a number and best time to call her back I call her back and discuss in more detail if she would prefer to talk to me.

## 2010-11-10 ENCOUNTER — Ambulatory Visit (HOSPITAL_COMMUNITY): Payer: Self-pay | Attending: Family Medicine

## 2011-01-26 ENCOUNTER — Telehealth: Payer: Self-pay | Admitting: Family Medicine

## 2011-01-26 NOTE — Telephone Encounter (Signed)
Catherine Hamilton would like to speak with some symptoms she is having.  She has had these symptoms before.

## 2011-01-26 NOTE — Telephone Encounter (Signed)
[facility]  Pt sts that she has the exact same thing as last time (BV) and would like something called in.  She is not having any itching just discharge and odor.  Pt states that she is still making payments on her last visit as she has no insurance.  Is really hoping that the MD can call in what she was Rx back in July. Domingos Riggi, Maryjo Rochester

## 2011-01-27 NOTE — Telephone Encounter (Signed)
Pt made aware and denies appt at this time.  She states " I just cant get another bill right now Im Paying off the other one."  Pt made aware of further complications if this is not taken care of, but still unable to make appt Fleeger, Catherine Hamilton

## 2011-01-27 NOTE — Telephone Encounter (Signed)
Pt was treated for BV and had a urine culture that grew Klebsiella last visit.  She needs to come in for an appointment so her urine can be re-checked and a wet prep done.

## 2011-02-01 LAB — URINALYSIS, ROUTINE W REFLEX MICROSCOPIC
Glucose, UA: NEGATIVE
Ketones, ur: NEGATIVE
Nitrite: NEGATIVE
Specific Gravity, Urine: <1.005 — ABNORMAL LOW
pH: 6

## 2011-02-02 LAB — GLUCOSE, CAPILLARY: Glucose-Capillary: 130 — ABNORMAL HIGH

## 2011-02-04 ENCOUNTER — Telehealth: Payer: Self-pay | Admitting: Family Medicine

## 2011-02-04 NOTE — Telephone Encounter (Signed)
Ms. Ottaway is calling because she is trying to cancel her gym membership but has been told she will need a note from her PCP in order to do this.

## 2011-02-05 LAB — COMPREHENSIVE METABOLIC PANEL
Albumin: 2.7 g/dL — ABNORMAL LOW (ref 3.5–5.2)
BUN: 9 mg/dL (ref 6–23)
BUN: 9 mg/dL (ref 6–23)
CO2: 22 mEq/L (ref 19–32)
Chloride: 107 mEq/L (ref 96–112)
Creatinine, Ser: 0.66 mg/dL (ref 0.4–1.2)
Creatinine, Ser: 0.7 mg/dL (ref 0.4–1.2)
GFR calc non Af Amer: >60 mL/min (ref >60)
Potassium: 4.3 mEq/L (ref 3.5–5.1)
Total Bilirubin: 0.3 mg/dL (ref 0.3–1.2)
Total Protein: 6.4 g/dL (ref 6.0–8.3)

## 2011-02-05 LAB — CBC
HCT: 32.7 % — ABNORMAL LOW (ref 36.0–46.0)
HCT: 32.9 % — ABNORMAL LOW (ref 36.0–46.0)
Hemoglobin: 10.9 g/dL — ABNORMAL LOW (ref 12.0–15.0)
Hemoglobin: 11.1 g/dL — ABNORMAL LOW (ref 12.0–15.0)
MCHC: 33.5 g/dL (ref 30.0–36.0)
MCV: 90.1 fL (ref 78.0–100.0)
MCV: 90.5 fL (ref 78.0–100.0)
Platelets: 173 10*3/uL (ref 150–400)
RBC: 3.61 MIL/uL — ABNORMAL LOW (ref 3.87–5.11)
RBC: 3.65 MIL/uL — ABNORMAL LOW (ref 3.87–5.11)
RDW: 19 % — ABNORMAL HIGH (ref 11.5–15.5)
WBC: 14 10*3/uL — ABNORMAL HIGH (ref 4.0–10.5)
WBC: 8.4 K/uL (ref 4.0–10.5)

## 2011-02-05 LAB — COMPREHENSIVE METABOLIC PANEL WITH GFR
ALT: 15 U/L (ref 0–35)
AST: 22 U/L (ref 0–37)
Alkaline Phosphatase: 117 U/L (ref 39–117)
CO2: 21 meq/L (ref 19–32)
Calcium: 8.8 mg/dL (ref 8.4–10.5)
Chloride: 106 meq/L (ref 96–112)
GFR calc Af Amer: >60 mL/min (ref >60)
GFR calc non Af Amer: >60 mL/min (ref >60)
Glucose, Bld: 85 mg/dL (ref 70–99)
Sodium: 136 meq/L (ref 135–145)
Total Bilirubin: 0.2 mg/dL — ABNORMAL LOW (ref 0.3–1.2)

## 2011-02-05 LAB — URIC ACID
Uric Acid, Serum: 6.3 mg/dL (ref 2.4–7.0)
Uric Acid, Serum: 6.7 mg/dL (ref 2.4–7.0)

## 2011-02-05 LAB — URINALYSIS, DIPSTICK ONLY
Glucose, UA: NEGATIVE mg/dL
Ketones, ur: NEGATIVE mg/dL
Nitrite: NEGATIVE
Specific Gravity, Urine: <1.005 — ABNORMAL LOW (ref 1.005–1.030)

## 2011-02-05 LAB — RPR: RPR Ser Ql: NONREACTIVE

## 2011-02-05 NOTE — Telephone Encounter (Signed)
Pt states that she needs a letter to get out of gym membership.  Advised that when they say letter from doctor they mean if there is a physical aliment that renders them unable to go to the gym.  Pt then ask if the doctor could make up a aliment to get her out of this.  Advised that would be wrong and illegal.  Pt ask that I please ask the MD and let her know that she "really really really needs this".  To MD Fleeger, Maryjo Rochester

## 2011-02-05 NOTE — Telephone Encounter (Signed)
Ms. Woodhead has called again inquiring about this note.

## 2011-02-09 NOTE — Telephone Encounter (Signed)
Called patient, left message to let her know I have no medical reason to say she cannot go to the gym.  It would be lying and illegal to say she can't go to the gym.

## 2011-04-02 ENCOUNTER — Ambulatory Visit: Payer: Self-pay | Admitting: Family Medicine

## 2011-04-06 ENCOUNTER — Ambulatory Visit: Payer: Self-pay | Admitting: Family Medicine

## 2011-05-21 ENCOUNTER — Ambulatory Visit: Payer: Self-pay | Admitting: Family Medicine

## 2011-05-25 ENCOUNTER — Ambulatory Visit (INDEPENDENT_AMBULATORY_CARE_PROVIDER_SITE_OTHER): Payer: Medicaid Other | Admitting: Family Medicine

## 2011-05-25 ENCOUNTER — Encounter: Payer: Self-pay | Admitting: Family Medicine

## 2011-05-25 VITALS — BP 106/73 | HR 77 | Ht 61.0 in | Wt 102.0 lb

## 2011-05-25 DIAGNOSIS — Z30432 Encounter for removal of intrauterine contraceptive device: Secondary | ICD-10-CM

## 2011-05-25 DIAGNOSIS — N898 Other specified noninflammatory disorders of vagina: Secondary | ICD-10-CM

## 2011-05-25 LAB — POCT WET PREP (WET MOUNT): Clue Cells Wet Prep HPF POC: NEGATIVE

## 2011-05-25 MED ORDER — FLUCONAZOLE 150 MG PO TABS: 150.0000 mg | ORAL_TABLET | Freq: Once | ORAL | Status: AC

## 2011-05-25 NOTE — Progress Notes (Signed)
Catherine Hamilton is a 21 year old woman who presents to clinic today with vaginal discharge and a desire to remove her IUD.    She has had the Mirena IUD for 3 years. He was placed after the delivery of her child.  She notes that she has had frequent bacterial vaginosis and would like it removed. Additionally she would like it removed because she likes having regular periods and she is no longer menstruating.  She denies any pain or dysuria.  She does not have regular sexual partner.  She is unsure what she would like to do for birth control.  She is interested in the Implanon.  PMH reviewed.  ROS as above otherwise neg Medications reviewed. No current outpatient prescriptions on file.   Exam:  BP 106/73  Pulse 77  Ht 5\' 1"  (1.549 m)  Wt 102 lb (46.267 kg)  BMI 19.27 kg/m2 Gen: Well NAD Abd: NABS, NT, ND GYN: Normal external genitalia and vaginal canal. Cervix present with no discharge.  IUD strings are present.  IUD was easily removed with no pain or bleeding.   Marland Kitchen

## 2011-05-25 NOTE — Assessment & Plan Note (Signed)
Vaginal discharge appears to be yeast. Will prescribe fluconazole 150 with one refill. Prescription will be sent in to the pharmacy.

## 2011-05-25 NOTE — Patient Instructions (Signed)
Thank you for coming in today. I will call you with the results of your vaginal discharge test. If we cannot get in touch check your pharmacy tomorrow. Because you are not having regular sex condoms in the morning after pill as needed for accidents are okay. I would recommend longer birth control options if you do get into a relationship where you are having sex. Call any time I recommend the Implanon device.

## 2011-05-25 NOTE — Assessment & Plan Note (Signed)
Patient desires of IUD removal. She is discussed doctors before about this. IUD removed today.  Plan for her birth control with condoms and morning-after pill as needed. If she does start having sex regularly we will switch to other long-acting contraception such as depo shot or Implanon.

## 2011-05-26 ENCOUNTER — Telehealth: Payer: Self-pay | Admitting: Family Medicine

## 2011-05-31 NOTE — Telephone Encounter (Signed)
Please call patient with results from 1/22 visit last week.

## 2011-06-01 ENCOUNTER — Encounter: Payer: Self-pay | Admitting: Family Medicine

## 2011-06-01 NOTE — Telephone Encounter (Signed)
Letter sent.

## 2011-07-27 ENCOUNTER — Ambulatory Visit (INDEPENDENT_AMBULATORY_CARE_PROVIDER_SITE_OTHER): Payer: Medicaid Other | Admitting: Family Medicine

## 2011-07-27 ENCOUNTER — Ambulatory Visit
Admission: RE | Admit: 2011-07-27 | Discharge: 2011-07-27 | Disposition: A | Discharge status: Home / Self Care | Payer: Medicaid Other | Source: Ambulatory Visit | Attending: Family Medicine | Admitting: Family Medicine

## 2011-07-27 ENCOUNTER — Encounter: Payer: Self-pay | Admitting: Family Medicine

## 2011-07-27 DIAGNOSIS — M549 Dorsalgia, unspecified: Secondary | ICD-10-CM | POA: Insufficient documentation

## 2011-07-27 DIAGNOSIS — Z3009 Encounter for other general counseling and advice on contraception: Secondary | ICD-10-CM

## 2011-07-27 DIAGNOSIS — N390 Urinary tract infection, site not specified: Secondary | ICD-10-CM

## 2011-07-27 DIAGNOSIS — R109 Unspecified abdominal pain: Secondary | ICD-10-CM

## 2011-07-27 LAB — POCT URINALYSIS DIPSTICK
Protein, UA: NEGATIVE
Spec Grav, UA: 1.02
Urobilinogen, UA: 0.2

## 2011-07-27 LAB — POCT UA - MICROSCOPIC ONLY: RBC, urine, microscopic: >20

## 2011-07-27 NOTE — Assessment & Plan Note (Signed)
No longer has Mirena- removed it due to belief it was causing pain but made no difference.  Discussed options for contraception.  Mirena may be her best option.  She is undecided and would like to think on this further.

## 2011-07-27 NOTE — Progress Notes (Signed)
  Subjective:    Patient ID: Catherine Hamilton, female    DOB: 1990-10-18, 21 y.o.   MRN: [id number]  HPI 3 years of back pain and 1 year of abdominal pain  Started after birth of son.  Was mild low back pain at that time.  For the past year back pain has gotten worse.  Present upon awakening in morning.  Worst with standing, better with sitting.  Radiates across upper abdomen at times.  May improve after urination.  In morning is present, better once standing up and after urinating.   Does sit-ups and crunches for exercise, runs 5x per week for 20 minutes- does not have back pain when she runs.  Started working in March @ [employer]- makes pain worse.  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  Does not take any medication for pain.  Review of SystemsNotes intermittent abd pain better after urination.  + urinary frequency.  No dysuria at outlet.  Denies vaginal discharge.    Objective:   Physical Exam GEN: Alert & Oriented, No acute distress CV:  Regular Rate & Rhythm, no murmur Respiratory:  Normal work of breathing, CTAB Abd:  + BS, soft, no sig tenderness to palpation Back: no flank pain, no TTP, full flexion, extension, lateral rotation without pain.           Assessment & Plan:

## 2011-07-27 NOTE — Assessment & Plan Note (Addendum)
Many years in duration, worsening, but overall mild and does not limit function at this time.  History not clearly elucidated and exam not consistent with history.  Upreg neg, recent cervical cultures neg.  Will order xray to rule out spine deformity, evaluate for large kidney stone.  Will treat for UTI today.  Advised to return in 2 weeks for further evaluation.  Depending on symptoms may consider further imaging for nephrolithiasis vs trial of physical therapy to improve function.

## 2011-07-27 NOTE — Assessment & Plan Note (Addendum)
Will culture urine, Keflex 500 bid x 7 days  Addendum:  Culture grew proteus.  If continues to have pain may consider CT for eval of kidney stone  (xray normal)

## 2011-07-27 NOTE — Patient Instructions (Signed)
Will get xray of low back  Follow-up with primary doctor in 2 weeks.  If continues to have pain, will consider physical therapy.

## 2011-07-28 ENCOUNTER — Telehealth: Payer: Self-pay | Admitting: Family Medicine

## 2011-07-28 MED ORDER — CEPHALEXIN 500 MG PO CAPS: 500.0000 mg | ORAL_CAPSULE | Freq: Four times a day (QID) | ORAL | Status: DC

## 2011-07-28 MED ORDER — CEPHALEXIN 500 MG PO CAPS: 500.0000 mg | ORAL_CAPSULE | Freq: Two times a day (BID) | ORAL | Status: AC

## 2011-07-28 NOTE — Telephone Encounter (Signed)
Pt thought that Dr Earnest Bailey was going to call in meds for her yesterday after her visit - pharmacy doesn't have it yet.  Also asking about results of xray

## 2011-07-28 NOTE — Telephone Encounter (Signed)
Discussed normal xr

## 2011-07-29 LAB — URINE CULTURE: Colony Count: >=100000

## 2011-08-03 ENCOUNTER — Ambulatory Visit: Payer: Medicaid Other | Admitting: Family Medicine

## 2011-08-20 ENCOUNTER — Ambulatory Visit (INDEPENDENT_AMBULATORY_CARE_PROVIDER_SITE_OTHER): Payer: Medicaid Other | Admitting: Family Medicine

## 2011-08-20 ENCOUNTER — Telehealth: Payer: Self-pay | Admitting: Family Medicine

## 2011-08-20 ENCOUNTER — Encounter: Payer: Self-pay | Admitting: Family Medicine

## 2011-08-20 VITALS — BP 98/62 | HR 68 | Temp 98.3°F | Ht 61.0 in | Wt 103.0 lb

## 2011-08-20 DIAGNOSIS — R300 Dysuria: Secondary | ICD-10-CM

## 2011-08-20 DIAGNOSIS — N390 Urinary tract infection, site not specified: Secondary | ICD-10-CM

## 2011-08-20 LAB — POCT URINALYSIS DIPSTICK
Ketones, UA: NEGATIVE
pH, UA: 6

## 2011-08-20 LAB — POCT UA - MICROSCOPIC ONLY

## 2011-08-20 MED ORDER — CEPHALEXIN 500 MG PO CAPS: 500.0000 mg | ORAL_CAPSULE | Freq: Three times a day (TID) | ORAL | Status: AC

## 2011-08-20 NOTE — Telephone Encounter (Signed)
Spoke with patient and advised she will need to come in. Appointment scheduled today.

## 2011-08-20 NOTE — Telephone Encounter (Signed)
Pt was seen for UTI a few weeks ago and has it again.  Wants to know if she could get an abx called in or something stronger.

## 2011-08-20 NOTE — Assessment & Plan Note (Addendum)
Frequent urinary tract infections- with different organisms  Last one proteus.  Will terat with Keflex TID x 7 days.  If culture today shows proteus gain, will obtain CT to evaluate for renal stone as nidus of relapsing infection.  Will lose insurance at end of the month.  Advised her to keep follow-up with her PCP next week.  Relapsing proteus again- will check CT for stones

## 2011-08-20 NOTE — Progress Notes (Signed)
  Subjective:    Patient ID: Catherine Hamilton, female    DOB: [birth year], 21 y.o.   MRN: [id number]  HPI Had UTI- improved with Keflex for several weeks, now returned  Past week- burning when urination, lower abdominal pain, urinary frequency.  Continued low back pain.  Sensation of incomplete bladder emptying.  PMH of frequent urinary tract infections.  No fever, nausea, vomiting,  History of immunocompromise.  I have reviewed patient's  PMH, FH, and Social history and Medications as related to this visit.  01/2010: E Coli 11/2010: Klebsiella 07/2011: Proteus  Review of Systems se ehpi HPI    Objective:   Physical Exam  GEN: Alert & Oriented, No acute distress CV:  Regular Rate & Rhythm, no murmur Respiratory:  Normal work of breathing, CTAB Abd:  + BS, soft, no tenderness to palpation, no flank pain        Assessment & Plan:

## 2011-08-20 NOTE — Patient Instructions (Signed)
Will treat you with Keflex for urinary tract infection.  If urine grows same bacteria as last time- will call you to consider CT abdomen to look for stones  Urinary Tract Infection Infections of the urinary tract can start in several places. A bladder infection (cystitis), a kidney infection (pyelonephritis), and a prostate infection (prostatitis) are different types of urinary tract infections (UTIs). They usually get better if treated with medicines (antibiotics) that kill germs. Take all the medicine until it is gone. You or your child may feel better in a few days, but TAKE ALL MEDICINE or the infection may not respond and may become more difficult to treat. HOME CARE INSTRUCTIONS    Drink enough water and fluids to keep the urine clear or pale yellow. Cranberry juice is especially recommended, in addition to large amounts of water.   Avoid caffeine, tea, and carbonated beverages. They tend to irritate the bladder.   Alcohol may irritate the prostate.   Only take over-the-counter or prescription medicines for pain, discomfort, or fever as directed by your caregiver.  To prevent further infections:  Empty the bladder often. Avoid holding urine for long periods of time.   After a bowel movement, women should cleanse from front to back. Use each tissue only once.   Empty the bladder before and after sexual intercourse.  FINDING OUT THE RESULTS OF YOUR TEST Not all test results are available during your visit. If your or your child's test results are not back during the visit, make an appointment with your caregiver to find out the results. Do not assume everything is normal if you have not heard from your caregiver or the medical facility. It is important for you to follow up on all test results. SEEK MEDICAL CARE IF:    There is back pain.   Your baby is older than 3 months with a rectal temperature of 100.5 F (38.1 C) or higher for more than 1 day.   Your or your child's problems  (symptoms) are no better in 3 days. Return sooner if you or your child is getting worse.  SEEK IMMEDIATE MEDICAL CARE IF:    There is severe back pain or lower abdominal pain.   You or your child develops chills.   You have a fever.   Your baby is older than 3 months with a rectal temperature of 102 F (38.9 C) or higher.   Your baby is 70 months old or younger with a rectal temperature of 100.4 F (38 C) or higher.   There is nausea or vomiting.   There is continued burning or discomfort with urination.  MAKE SURE YOU:    Understand these instructions.   Will watch your condition.   Will get help right away if you are not doing well or get worse.  Document Released: 01/27/2005 Document Revised: 04/08/2011 Document Reviewed: 09/01/2006 [facility] Patient Information 2012 [place], [place].

## 2011-08-23 ENCOUNTER — Telehealth: Payer: Self-pay | Admitting: Family Medicine

## 2011-08-23 ENCOUNTER — Telehealth: Payer: Self-pay | Admitting: *Deleted

## 2011-08-23 LAB — URINE CULTURE: Colony Count: >=100000

## 2011-08-23 NOTE — Telephone Encounter (Signed)
Spoke with patinet recurrent proteus infection- will check CT for stones

## 2011-08-23 NOTE — Telephone Encounter (Signed)
Spoke with patient to inform her of CT set up at [facility] [address]. 4/24@ 12:30pm, patient to arrive at 12:15pm. Patient has some issues with the appointment time and will call them to reschedule times.

## 2011-08-23 NOTE — Progress Notes (Signed)
Addended by: Macy Mis on: 08/23/2011 12:18 PM   Modules accepted: Orders

## 2011-08-25 ENCOUNTER — Ambulatory Visit
Admission: RE | Admit: 2011-08-25 | Discharge: 2011-08-25 | Disposition: A | Discharge status: Home / Self Care | Payer: Medicaid Other | Source: Ambulatory Visit | Attending: Family Medicine | Admitting: Family Medicine

## 2011-08-25 DIAGNOSIS — N390 Urinary tract infection, site not specified: Secondary | ICD-10-CM

## 2011-08-26 ENCOUNTER — Telehealth: Payer: Self-pay | Admitting: Family Medicine

## 2011-08-26 DIAGNOSIS — N200 Calculus of kidney: Secondary | ICD-10-CM

## 2011-08-26 DIAGNOSIS — N390 Urinary tract infection, site not specified: Secondary | ICD-10-CM

## 2011-08-26 NOTE — Assessment & Plan Note (Signed)
Likely source of recurrent UTI with proteus, klebsiella.  Will try to expedite urology referral as medicaid expires at the end of the month- to ask if she would benefit from lithotripsy.   Papers faxed- Patient to be seen by PCP tomorrow for contraception- still on keflex for treatment of UTI.  Will asks PCP to follow-up on expedited referral.  In future may consider chronic suppression with bactrim for recurrent UTI if not able to be seen by urology.

## 2011-08-26 NOTE — Assessment & Plan Note (Signed)
Will refer to urology

## 2011-08-26 NOTE — Telephone Encounter (Signed)
Spoke with patient about stones found on ct.  Will refer to urology.  Improving uti on keflex

## 2011-08-27 ENCOUNTER — Ambulatory Visit (INDEPENDENT_AMBULATORY_CARE_PROVIDER_SITE_OTHER): Payer: Medicaid Other | Admitting: Family Medicine

## 2011-08-27 ENCOUNTER — Encounter: Payer: Self-pay | Admitting: Family Medicine

## 2011-08-27 ENCOUNTER — Ambulatory Visit: Payer: Medicaid Other | Admitting: Family Medicine

## 2011-08-27 DIAGNOSIS — Z309 Encounter for contraceptive management, unspecified: Secondary | ICD-10-CM

## 2011-08-27 DIAGNOSIS — Z3046 Encounter for surveillance of implantable subdermal contraceptive: Secondary | ICD-10-CM

## 2011-08-27 DIAGNOSIS — Z30017 Encounter for initial prescription of implantable subdermal contraceptive: Secondary | ICD-10-CM

## 2011-08-27 MED ORDER — ETONOGESTREL 68 MG ~~LOC~~ IMPL
68.0000 mg | DRUG_IMPLANT | Freq: Once | SUBCUTANEOUS | Status: AC
  Administered 2011-08-27: 68 mg via SUBCUTANEOUS

## 2011-08-27 NOTE — Patient Instructions (Signed)
It was good to see you.  Please keep the dressing on your arm for 24 hours.  The Nexplanon is good for birth control for 3 years from today.

## 2011-08-27 NOTE — Progress Notes (Signed)
Patient ID: Catherine Hamilton, female   DOB: 12-07-1990, 21 y.o.   MRN: [id number]  Patient comes in to discuss birth control options.  She had a Mirena for about 3 years but took it out (herself) because of recurrent yeast infections.  She says she is not very good about taking birth control pills, and wanted to hear the side effects of Nexplanon.  I advised that irregular menses and spotting are the most common side effects.  Patient stated she would like to have Nexplanon placed today.   PROCEDURE NOTE: Nexplanon insertion Patient given informed consent, signed copy in the chart.  Appropriate time out taken  Pregnancy test was Negative.  [name]  Left  arm was prepped and draped in the usual sterile fashion.. The ruler used to measure and mark the insertion area 8 cm from medial epicondyle of the elbow. Local anaesthesia obtained using 1.5 cc of 1% lidocaine with epinephrine. Nexplanon was inserted per manufacturer's directions. Less than 3 cc blood loss. The insertion site covered with antibiotic ointment and a pressure bandage to minimize bruising. There were no complications and the patient tolerated the procedure well.  Device information was given in handout form. Patient is informed the removal date will be in three years.

## 2011-09-14 ENCOUNTER — Ambulatory Visit: Payer: Medicaid Other | Admitting: Family Medicine

## 2011-09-14 ENCOUNTER — Telehealth: Payer: Self-pay | Admitting: Family Medicine

## 2011-09-14 NOTE — Telephone Encounter (Signed)
Patient states itching started a week ago at site of implant.. Had the implant  inserted 2.5 weeks ago.   Has some redness at site , area approx 2-3 inches in length, elongated. Appointment scheduled today at 4:00.

## 2011-09-14 NOTE — Telephone Encounter (Signed)
Patient is calling to discuss some symptoms she is having after having the Nexplanon inserted.  She has some itching and isn't sure if it is being caused by the Nexplanon.

## 2011-09-15 ENCOUNTER — Ambulatory Visit (INDEPENDENT_AMBULATORY_CARE_PROVIDER_SITE_OTHER): Payer: Medicaid Other | Admitting: Family Medicine

## 2011-09-15 ENCOUNTER — Encounter: Payer: Self-pay | Admitting: Family Medicine

## 2011-09-15 VITALS — BP 120/70 | HR 73 | Temp 98.4°F | Ht 61.0 in | Wt 103.0 lb

## 2011-09-15 DIAGNOSIS — T7840XA Allergy, unspecified, initial encounter: Secondary | ICD-10-CM

## 2011-09-15 MED ORDER — TRIAMCINOLONE ACETONIDE 0.1 % EX CREA: TOPICAL_CREAM | Freq: Two times a day (BID) | CUTANEOUS | Status: AC

## 2011-09-15 NOTE — Patient Instructions (Signed)
I want you to try this steroid cream on the skin that is itching.  Some women do have a small allergic reaction to the Nexplanon, but it often goes away in a month or two.  If it continues to itch, please come back in and see me.

## 2011-09-16 DIAGNOSIS — T7840XA Allergy, unspecified, initial encounter: Secondary | ICD-10-CM | POA: Insufficient documentation

## 2011-09-16 MED ORDER — ETONOGESTREL 68 MG ~~LOC~~ IMPL: 1.0000 | DRUG_IMPLANT | Freq: Once | SUBCUTANEOUS | Status: DC

## 2011-09-16 NOTE — Assessment & Plan Note (Signed)
This is a mild local allergic skin reaction to Nexplanon implant.  Discussed with Dr. Jennette Kettle, suspect this will go away with time, do not feel there is a risk of Anaphylaxis, no need to remove nexplanon at this time.  Will rx triamcinolone cream for symptomatic treatment and have patient follow up if not improved.

## 2011-09-16 NOTE — Progress Notes (Signed)
  Subjective:    Patient ID: Catherine Hamilton, female    DOB: [birth year], 21 y.o.   MRN: [id number]  HPI  Catherine Hamilton comes in because she is having itching and a few bumps at the site where her nexplanon was inserted.  I placed it on 4/26.  She says it was fine for about two weeks, but started itching a week or two ago.  She was nervous because of the red bumps, because her mother had an infected dialysis graft a few years ago.  She has not put anything on it.  She denies throat/mouth swelling, wheezing, shortness of breath, hives, and rash or itching anywhere else. She denies pain in the arm, just endorses itching.   Review of Systems Pertinent items in HPI.     Objective:   Physical Exam BP 120/70  Pulse 73  Temp(Src) 98.4 F (36.9 C) (Oral)  Ht 5\' 1"  (1.549 m)  Wt 103 lb (46.72 kg)  BMI 19.46 kg/m2  LMP 08/23/2011 General appearance: alert, cooperative and no distress Left Arm: Medial upper arm has small rash with erythematous papules about 4 cm wide and 1 cm high.  The nexplanon is palpable under the rash.  She has a small scar at the insertion site which has healed well.  There is no warmth, no pain to palpation, and no fluctuance.        Assessment & Plan:

## 2011-09-21 ENCOUNTER — Telehealth: Payer: Self-pay | Admitting: Family Medicine

## 2011-09-21 NOTE — Telephone Encounter (Signed)
Patient is calling because she has Kidney Stones and wants to know if drinking coffee will affect that.

## 2011-09-21 NOTE — Telephone Encounter (Signed)
Dr. Leveda Anna advises that coffee drinking will not increase risk of kidney or cause worsening.

## 2011-09-28 ENCOUNTER — Telehealth: Payer: Self-pay | Admitting: Family Medicine

## 2011-09-28 NOTE — Telephone Encounter (Signed)
Pt has Nexplanon and has been bleeding for 2 weeks.  Wants to speak to nurse to see if that is normal

## 2011-09-28 NOTE — Telephone Encounter (Signed)
Returned call to patient.  States her period came on, but now has been having "brown spotting" for a few days.  Spotting not heavy; able to wear panty liner.  Patient informed that abnormal bleeding/menses is common side effect of Nexplanon.  Will need to give body time to adjust to new hormones and bleeding should subside.  Patient verbalized understanding and will call back as needed.  Gaylene Brooks, RN

## 2011-12-24 ENCOUNTER — Telehealth: Payer: Self-pay | Admitting: Family Medicine

## 2011-12-24 NOTE — Telephone Encounter (Signed)
Spoke with patient and she states  she had Nexplanon inserted in May  and bled for  2 weeks  following insertion. Then she has had no further bleeding until the first of  August . Since then has had bleeding daily , sometimes spotting and sometimes heavier.   Consulted with Dr. Deirdre Priest and he suggested she come in  office next week . However she states she is in [place] and will not be back until mid Sept.  Dr. Deirdre Priest advises if she has chest pain , lightheadness  needs to be seen  somewhere.  Advised to take OTC Ferrous Sulfate one daily.

## 2011-12-24 NOTE — Telephone Encounter (Signed)
Message left on voicemail to return call.

## 2011-12-24 NOTE — Telephone Encounter (Signed)
Patient has some questions about what is normal with the Nexplanon.  She said she got it at the beginning of the Summer and now she has had a period for the entire month of August and she doesn't know if that is normal.

## 2011-12-24 NOTE — Telephone Encounter (Signed)
Pt returned call

## 2012-09-26 ENCOUNTER — Telehealth: Payer: Self-pay | Admitting: Family Medicine

## 2012-09-26 NOTE — Telephone Encounter (Signed)
Spoke with patient and because her last appt here was 12/2011, I recommended she call back and make appt with Dr, Lula Olszewski for this week.  Pt. Agreed. I explained to patient that she would need to see MD before any determination able to be made.  Pt. Verbalized understanding.   Salim Forero, Darlyne Russian, CMA

## 2012-09-26 NOTE — Telephone Encounter (Signed)
Pt feels a lot of pressure in vaginal area. Questions about dropped cervix ? Please advise

## 2012-09-27 ENCOUNTER — Ambulatory Visit (INDEPENDENT_AMBULATORY_CARE_PROVIDER_SITE_OTHER): Payer: Medicaid Other | Admitting: Family Medicine

## 2012-09-27 ENCOUNTER — Telehealth: Payer: Self-pay | Admitting: Family Medicine

## 2012-09-27 ENCOUNTER — Encounter: Payer: Self-pay | Admitting: Family Medicine

## 2012-09-27 VITALS — BP 109/72 | HR 84 | Temp 98.2°F | Ht 62.0 in | Wt 112.0 lb

## 2012-09-27 DIAGNOSIS — N898 Other specified noninflammatory disorders of vagina: Secondary | ICD-10-CM

## 2012-09-27 DIAGNOSIS — N899 Noninflammatory disorder of vagina, unspecified: Secondary | ICD-10-CM

## 2012-09-27 LAB — POCT WET PREP (WET MOUNT)

## 2012-09-27 MED ORDER — FLUCONAZOLE 150 MG PO TABS: 150.0000 mg | ORAL_TABLET | Freq: Once | ORAL | Status: DC

## 2012-09-27 NOTE — Telephone Encounter (Signed)
LVM positive yeast, Rx Diflucan called in

## 2012-09-27 NOTE — Progress Notes (Signed)
  Subjective:    Patient ID: Catherine Hamilton, female    DOB: May 31, 1990, 22 y.o.   MRN: [id number]  HPI # [id number]  On Monday 05/23, she thinks her cervix came down.  This is the first time this has happened.   She endorses a lot of pressure yesterday. Today it is not so bad.  She has a lot of discharge and wonders if she has a yeast infection. She denies recent antibiotics.   Last intercourse: Friday 05/26 She also had really bad cramps  She did not use condoms. She is on birth control.   LMP: unsure (on birth control)  Review of Systems Per HPI Denies nausea, vomiting, fevers, hematuria, constipation, diarrhea, dysuria/frequency/urgency Endorses chills, vaginal irritation, dyspareunia   Allergies, medication, past medical history reviewed.  Smoking status noted. History of normal vaginal delivery 3-4 years ago   Social history: works as Scientist, physiological    Objective:   Physical Exam GEN: NAD; well-nourished, -appearing PULM: NI WOB SKIN: no rash PSYCH: not anxious or depressed appearing GU:    Vulva: mildly hypertrophied vulva   Vagina: thick discharge, non-tender   Cervix: posteriorly located, some mild vaginal wall prolapse   Rectum: no hemorrhoids     Assessment & Plan:

## 2012-09-27 NOTE — Patient Instructions (Addendum)
Check for yeast. If your lab results are normal, I will send you a letter with the results. If abnormal, someone at the clinic will get in touch with you.   Follow-up as needed I think it is appropriate to watch how things go for now but let me know if you would like Gynecology referral

## 2012-09-28 DIAGNOSIS — N898 Other specified noninflammatory disorders of vagina: Secondary | ICD-10-CM | POA: Insufficient documentation

## 2012-09-28 NOTE — Assessment & Plan Note (Signed)
Patient is concerned her cervix prolapsed a few days ago. She felt a bulge and when she looked she reports she saw her cervix "with the hole in it".  She has mild prolapse on speculum examination today but her cervix is in right posterior position.  We will monitor for now. Follow-up as needed; if she would like gynecology referral, I advised her to call and let me know. She declines at this time.  Wet prep shows yeast infection so treat with Diflucan.

## 2012-12-20 ENCOUNTER — Encounter: Payer: Self-pay | Admitting: Family Medicine

## 2012-12-20 ENCOUNTER — Ambulatory Visit (INDEPENDENT_AMBULATORY_CARE_PROVIDER_SITE_OTHER): Payer: Medicaid Other | Admitting: Family Medicine

## 2012-12-20 ENCOUNTER — Other Ambulatory Visit (HOSPITAL_COMMUNITY)
Admission: RE | Admit: 2012-12-20 | Discharge: 2012-12-20 | Disposition: A | Discharge status: Home / Self Care | Payer: Medicaid Other | Source: Ambulatory Visit | Attending: Family Medicine | Admitting: Family Medicine

## 2012-12-20 VITALS — BP 110/72 | HR 68 | Temp 98.2°F | Wt 115.0 lb

## 2012-12-20 DIAGNOSIS — Z01419 Encounter for gynecological examination (general) (routine) without abnormal findings: Secondary | ICD-10-CM | POA: Insufficient documentation

## 2012-12-20 DIAGNOSIS — Z124 Encounter for screening for malignant neoplasm of cervix: Secondary | ICD-10-CM

## 2012-12-20 DIAGNOSIS — N899 Noninflammatory disorder of vagina, unspecified: Secondary | ICD-10-CM

## 2012-12-20 DIAGNOSIS — N898 Other specified noninflammatory disorders of vagina: Secondary | ICD-10-CM

## 2012-12-20 DIAGNOSIS — R399 Unspecified symptoms and signs involving the genitourinary system: Secondary | ICD-10-CM

## 2012-12-20 DIAGNOSIS — R3989 Other symptoms and signs involving the genitourinary system: Secondary | ICD-10-CM

## 2012-12-20 LAB — POCT WET PREP (WET MOUNT)

## 2012-12-20 LAB — POCT URINALYSIS DIPSTICK
Leukocytes, UA: NEGATIVE
Nitrite, UA: NEGATIVE
Protein, UA: NEGATIVE
Urobilinogen, UA: 0.2

## 2012-12-20 LAB — POCT UA - MICROSCOPIC ONLY

## 2012-12-20 MED ORDER — FLUCONAZOLE 150 MG PO TABS: 150.0000 mg | ORAL_TABLET | Freq: Once | ORAL | Status: DC

## 2012-12-20 NOTE — Addendum Note (Signed)
Addended by: [place], Jenesis Martin on: 12/20/2012 05:39 PM   Modules accepted: Orders

## 2012-12-20 NOTE — Assessment & Plan Note (Signed)
Will treat at yeast with Diflucan. Wet prep pending. Will call with results if needed. Encouraged to call me if symptoms fail to improve.

## 2012-12-20 NOTE — Progress Notes (Signed)
Patient ID: Catherine Hamilton, female   DOB: 1990-09-17, 22 y.o.   MRN: [id number]  [facility] Candice Lunney M. Caydn Justen, MD Phone: [phone]   Subjective: HPI: Patient is a 22 y.o. female presenting to clinic today for concern for UTI.  DYSURIA  Onset: 2 weeks ago   Worsening: no, about the same    Symptoms Urgency: no  Frequency: yes  Hesitancy: yes  Hematuria: no  Flank Pain: no  Fever: no    Nausea/Vomiting: no  Pregnant: no STD exposure/history: no Discharge: yes, white thick, no odor with associated itching Irritants: no Rash: no  Red Flags  : (Risk Factors for Complicated UTI) Recent Antibiotic Usage (last 30 days): no  Symptoms lasting more than seven (7) days: yes  More than 3 UTI's last 12 months: yes, was seen by urology last year and was told she had kidney stones. Was on Macrobid for one year and felt better. As soon as she stopped, UTI returned PMH of  1. DM: no 2. Renal Disease/Calculi: yes 3. Urinary Tract Abnormality: no  4. Instrumentation/Trauma: no 5. Immunosuppression: no  History Reviewed: Non smoker.  ROS: Please see HPI above.  Objective: Office vital signs reviewed. BP 110/72  Pulse 68  Temp(Src) 98.2 F (36.8 C) (Oral)  Wt 115 lb (52.164 kg)  BMI 21.03 kg/m2  Physical Examination:  General: Awake, alert. NAD HEENT: Atraumatic, normocephalic. MMM Pulm: CTAB, no wheezes Cardio: RRR, no murmurs appreciated Abdomen: soft, nontender, nondistended. No suprapubic tenderness or CVA tenderness GU: External genitalia wnl. Scant white discharge in vagina. Cervix visualized, no bleeding after pap Extremities: No edema Neuro: Grossly intact  Results for orders placed in visit on 12/20/12 (from the past 24 hour(s))  POCT URINALYSIS DIPSTICK     Status: Abnormal   Collection Time    12/20/12  9:13 AM      Result Value Range   Color, UA yellow     Clarity, UA clear     Glucose, UA neg     Bilirubin, UA neg     Ketones, UA  neg     Spec Grav, UA 1.025     Blood, UA trace-intact     pH, UA 6.5     Protein, UA neg     Urobilinogen, UA 0.2     Nitrite, UA neg     Leukocytes, UA Negative     Narrative:    Reflex to microscopic   Wet prep pending  Assessment: 22 y.o. female with discharge and dysuria  Plan: See Problem List and After Visit Summary

## 2012-12-20 NOTE — Assessment & Plan Note (Signed)
Known history of renal stones. No longer on ppx antibiotics. UA not convincing for UTI. Will send for micro and culture.

## 2012-12-20 NOTE — Patient Instructions (Addendum)
It does not look like you have a UTI. We will wait to see if anything grows on the urine culture since you have had a lot of problems in the past. For now, I am going to treat your yeast infection. You can try over the counter AZO for your discomfort as well. If you continue to have symptoms, please call me and we can discuss other options.  I will call you with any abnormal results, or otherwise I will send you a letter.  Markese Bloxham M. Syla Devoss, M.D.

## 2012-12-22 ENCOUNTER — Encounter: Payer: Self-pay | Admitting: Family Medicine

## 2013-02-05 ENCOUNTER — Other Ambulatory Visit (HOSPITAL_COMMUNITY)
Admission: RE | Admit: 2013-02-05 | Discharge: 2013-02-05 | Disposition: A | Discharge status: Home / Self Care | Payer: Medicaid Other | Source: Ambulatory Visit | Attending: Family Medicine | Admitting: Family Medicine

## 2013-02-05 ENCOUNTER — Ambulatory Visit (INDEPENDENT_AMBULATORY_CARE_PROVIDER_SITE_OTHER): Payer: Medicaid Other | Admitting: Family Medicine

## 2013-02-05 ENCOUNTER — Telehealth: Payer: Self-pay | Admitting: Family Medicine

## 2013-02-05 VITALS — BP 114/61 | HR 78 | Temp 99.1°F | Ht 61.0 in | Wt 118.0 lb

## 2013-02-05 DIAGNOSIS — N760 Acute vaginitis: Secondary | ICD-10-CM

## 2013-02-05 DIAGNOSIS — R3989 Other symptoms and signs involving the genitourinary system: Secondary | ICD-10-CM

## 2013-02-05 DIAGNOSIS — Z113 Encounter for screening for infections with a predominantly sexual mode of transmission: Secondary | ICD-10-CM | POA: Insufficient documentation

## 2013-02-05 DIAGNOSIS — R399 Unspecified symptoms and signs involving the genitourinary system: Secondary | ICD-10-CM

## 2013-02-05 DIAGNOSIS — R300 Dysuria: Secondary | ICD-10-CM

## 2013-02-05 DIAGNOSIS — N898 Other specified noninflammatory disorders of vagina: Secondary | ICD-10-CM

## 2013-02-05 LAB — POCT WET PREP (WET MOUNT)

## 2013-02-05 LAB — POCT UA - MICROSCOPIC ONLY

## 2013-02-05 MED ORDER — CEPHALEXIN 500 MG PO CAPS: 500.0000 mg | ORAL_CAPSULE | Freq: Three times a day (TID) | ORAL | Status: DC

## 2013-02-05 MED ORDER — FLUCONAZOLE 150 MG PO TABS: 150.0000 mg | ORAL_TABLET | Freq: Once | ORAL | Status: DC

## 2013-02-05 MED ORDER — PHENAZOPYRIDINE HCL 100 MG PO TABS: 100.0000 mg | ORAL_TABLET | Freq: Three times a day (TID) | ORAL | Status: DC | PRN

## 2013-02-05 NOTE — Progress Notes (Signed)
  Subjective:    Patient ID: Catherine Hamilton, female    DOB: [birth year], 22 y.o.   MRN: [id number]  HPI 22 yo F presents for same day visit:  Dysuria: x one week. Associated with frequency and urgency, suprapubic pain, nausea. No hematuria, fever, flank pain or vomiting.  Sexually active with one partner. No condoms, nexplanon for contraception. Scant white vaginal discharge with itching. No vaginal lesions. Took some of moms old pyridium this AM.   Review of Systems As per HPI      Objective:   Physical Exam BP 114/61  Pulse 78  Temp(Src) 99.1 F (37.3 C) (Oral)  Ht 5\' 1"  (1.549 m)  Wt 118 lb (53.524 kg)  BMI 22.31 kg/m2 General appearance: alert, cooperative and no distress Back: no CVA tenderness  Abdomen: soft, non-tender; bowel sounds normal; no masses,  no organomegaly Pelvic exam:  External: normal Vagina: scant thick white vaginal discharge. Cervix: scant orange mucoid discharge.  Bimanual: no CMT, no uterine or adnexal tenderness.   UA: lrg leukocytes,trace RBC U micro:TNTC WBC, 1-3 RBC.  Wet prep: negative GC/Chlam: pending     Assessment & Plan:

## 2013-02-05 NOTE — Assessment & Plan Note (Addendum)
A: s/s and UA consistent with lower UTI P:  Please start keflex 500 mg every 8 hrs for 5 days. Once you are done with keflex take diflucan 150 mg PO x one. You may continue pyridium for pain with urination.  F/u if symptoms persist x one week.

## 2013-02-05 NOTE — Patient Instructions (Addendum)
Ms. Flanery,  Thank you for coming in today. Please start keflex 500 mg every 8 hrs for 5 days. Once you are done with keflex take diflucan 150 mg PO x one. You may continue pyridium for pain with urination.  F/u if symptoms persist x one week.  Dr. Armen Pickup

## 2013-02-06 ENCOUNTER — Telehealth: Payer: Self-pay | Admitting: Family Medicine

## 2013-02-07 ENCOUNTER — Encounter: Payer: Self-pay | Admitting: *Deleted

## 2013-02-07 NOTE — Telephone Encounter (Signed)
Please call patient. Negative Gc/chlam and wet prep.

## 2013-02-07 NOTE — Telephone Encounter (Signed)
Left message for pt to call back.  Please give results of labs when she does.  Thanks [employer]

## 2013-06-21 ENCOUNTER — Ambulatory Visit: Payer: Medicaid Other | Admitting: Family Medicine

## 2013-06-29 ENCOUNTER — Ambulatory Visit: Payer: Medicaid Other | Admitting: Family Medicine

## 2013-07-13 ENCOUNTER — Encounter: Payer: Self-pay | Admitting: Family Medicine

## 2013-07-13 ENCOUNTER — Ambulatory Visit (INDEPENDENT_AMBULATORY_CARE_PROVIDER_SITE_OTHER): Payer: Medicaid Other | Admitting: Family Medicine

## 2013-07-13 VITALS — BP 110/70 | Temp 98.3°F | Ht 60.5 in | Wt 122.0 lb

## 2013-07-13 DIAGNOSIS — M79672 Pain in left foot: Principal | ICD-10-CM

## 2013-07-13 DIAGNOSIS — Z7689 Persons encountering health services in other specified circumstances: Secondary | ICD-10-CM

## 2013-07-13 DIAGNOSIS — N8110 Cystocele, unspecified: Secondary | ICD-10-CM

## 2013-07-13 DIAGNOSIS — Z7189 Other specified counseling: Secondary | ICD-10-CM

## 2013-07-13 DIAGNOSIS — M79609 Pain in unspecified limb: Secondary | ICD-10-CM

## 2013-07-13 DIAGNOSIS — M79671 Pain in right foot: Secondary | ICD-10-CM

## 2013-07-13 DIAGNOSIS — IMO0002 Reserved for concepts with insufficient information to code with codable children: Secondary | ICD-10-CM
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
## 2013-07-13 NOTE — Patient Instructions (Signed)
-  see gyn for nexplanon removal and evaluation for uterine prolapse  -see opthomologist about your eye strain issues  -get gait analysis from off and running for she selection  -follow up as needed

## 2013-07-31 ENCOUNTER — Encounter: Payer: Self-pay | Admitting: Advanced Practice Midwife

## 2013-07-31 ENCOUNTER — Ambulatory Visit: Payer: BC Managed Care – PPO | Admitting: Family Medicine

## 2013-07-31 ENCOUNTER — Ambulatory Visit (INDEPENDENT_AMBULATORY_CARE_PROVIDER_SITE_OTHER): Payer: BC Managed Care – PPO | Admitting: Advanced Practice Midwife

## 2013-07-31 VITALS — BP 118/82 | HR 86 | Temp 98.5°F | Ht 61.0 in | Wt 122.0 lb

## 2013-07-31 DIAGNOSIS — R399 Unspecified symptoms and signs involving the genitourinary system: Secondary | ICD-10-CM

## 2013-07-31 DIAGNOSIS — R3989 Other symptoms and signs involving the genitourinary system: Secondary | ICD-10-CM

## 2013-07-31 DIAGNOSIS — Z3046 Encounter for surveillance of implantable subdermal contraceptive: Secondary | ICD-10-CM

## 2013-07-31 DIAGNOSIS — Z309 Encounter for contraceptive management, unspecified: Secondary | ICD-10-CM

## 2013-07-31 NOTE — Progress Notes (Signed)
Subjective:     Catherine RakeDianne Gahan is a 23 y.o. female here for a routine exam.  Current complaints: Patient is in the office today for a Problem Visit. Patient states her cycle started in February and she is still currently bleeding. Patient states she also had a cycle in December but before that she was not having cycles because of the Implant. Patient states the implant has been in since April 2013. Patient states she is having a lot of pain and she took AZO, Patient states the medication helped a little bit but not really. Patient states " it feels like her vagina is going to fall out". Patient states she is having irritation, urinary urgency and frequency. Patient denies any itching, burning, vaginal odor or discharge.Personal health questionnaire reviewed: yes.   Gynecologic History No LMP recorded. Contraception: Nexplanon Last Pap: 2014. Results were: normal  Obstetric History OB History  Gravida Para Term Preterm AB SAB TAB Ectopic Multiple Living  1 1 1            # Outcome Date GA Lbr Len/2nd Weight Sex Delivery Anes PTL Lv  1 TRM                The following portions of the patient's history were reviewed and updated as appropriate: allergies, current medications, past family history, past medical history, past social history, past surgical history and problem list.  Review of Systems Pertinent items are noted in HPI.    Objective:    BP 118/82  Pulse 86  Temp(Src) 98.5 F (36.9 C)  Ht 5\' 1"  (1.549 m)  Wt 122 lb (55.339 kg)  BMI 23.06 kg/m2 General appearance: alert and cooperative   Assessment:    Healthy female exam.   Candidate for Nuva Ring Needs annual exam Desires Implanon removed today secondary to AUB  Plan:    Nexplanon removed  , counseled patient on options including medication while Implanon/Nex remains in. Patient desires removal today. Will try Nuva Ring, samples given. Patient due for annual, will call to make this appt.  45 min spent with  patient greater than 80% spent in counseling and coordination of care.   Keeva Reisen Wilson SingerWren CNM

## 2013-08-01 DIAGNOSIS — Z309 Encounter for contraceptive management, unspecified: Secondary | ICD-10-CM | POA: Insufficient documentation

## 2013-08-01 DIAGNOSIS — Z3046 Encounter for surveillance of implantable subdermal contraceptive: Secondary | ICD-10-CM | POA: Insufficient documentation

## 2013-08-01 NOTE — Progress Notes (Signed)
Procedure Note Removal of Implanon/Nexplanon  Patient had Rod inserted in 2 years ago. Desires removal today.   Reviewed risk and benefits of procedure. Alternative options discussed Patient reported understanding and agreed to continue.   [name] left arm was palpated and the implant device located. The area was prepped with Betadinex3. The distal end of the device was palpated and 1 cc of 1% lidocaine without epinephrine was injected. A 1.5 mm incision was made. Any fibrotic tissue was carefully dissected away using blunt and/or sharp dissection. The device was removed in an intact manner. Steri-strips and a sterile dressing were applied to the incision.   Followup: The patient tolerated the procedure well without complications. Standard post-procedure care is explained and return precautions are given.  Patient plans NuvaRing vaginal inserts  Catherine Hamilton CNM

## 2013-08-04 LAB — URINE CULTURE: Colony Count: >=100000

## 2013-08-07 ENCOUNTER — Other Ambulatory Visit: Payer: Self-pay | Admitting: Advanced Practice Midwife

## 2013-08-07 DIAGNOSIS — N390 Urinary tract infection, site not specified: Secondary | ICD-10-CM

## 2013-08-07 MED ORDER — CIPROFLOXACIN HCL 500 MG PO TABS: 250.0000 mg | ORAL_TABLET | Freq: Two times a day (BID) | ORAL | Status: AC

## 2013-09-19 NOTE — Telephone Encounter (Signed)
error 

## 2013-11-15 ENCOUNTER — Ambulatory Visit: Payer: BC Managed Care – PPO | Admitting: Obstetrics

## 2013-11-15 ENCOUNTER — Telehealth: Payer: Self-pay | Admitting: *Deleted

## 2013-11-15 DIAGNOSIS — N390 Urinary tract infection, site not specified: Secondary | ICD-10-CM

## 2013-11-15 MED ORDER — SULFAMETHOXAZOLE-TMP DS 800-160 MG PO TABS: 1.0000 | ORAL_TABLET | Freq: Two times a day (BID) | ORAL | Status: DC

## 2013-11-15 NOTE — Telephone Encounter (Signed)
Patient called and could not come to her appointment due to lack of insurance.  LM on VM- will call in treatment for UTI- but if symptoms persist, she will need to be seen somewhere.

## 2013-11-15 NOTE — Telephone Encounter (Signed)
Patient thinks she has a UTI 11:48 LM on VM to CB

## 2013-11-19 ENCOUNTER — Telehealth: Payer: Self-pay | Admitting: *Deleted

## 2013-11-19 DIAGNOSIS — B3731 Acute candidiasis of vulva and vagina: Secondary | ICD-10-CM

## 2013-11-19 DIAGNOSIS — B373 Candidiasis of vulva and vagina: Secondary | ICD-10-CM

## 2013-11-19 MED ORDER — FLUCONAZOLE 150 MG PO TABS: 150.0000 mg | ORAL_TABLET | Freq: Once | ORAL | Status: DC

## 2013-11-19 NOTE — Telephone Encounter (Signed)
Patient states she was unable to keep her appointment last week due to lack of insurance. Patient is requesting treatment for yeast. Diflucan sent the pharmacy per office protocol.

## 2013-11-28 ENCOUNTER — Encounter: Payer: Self-pay | Admitting: Family Medicine

## 2013-11-28 ENCOUNTER — Ambulatory Visit (INDEPENDENT_AMBULATORY_CARE_PROVIDER_SITE_OTHER): Payer: Self-pay | Admitting: Family Medicine

## 2013-11-28 ENCOUNTER — Telehealth: Payer: Self-pay | Admitting: Family Medicine

## 2013-11-28 ENCOUNTER — Ambulatory Visit: Payer: Self-pay | Admitting: Family Medicine

## 2013-11-28 VITALS — BP 106/62 | HR 67 | Temp 98.6°F | Ht 61.0 in | Wt 119.0 lb

## 2013-11-28 DIAGNOSIS — N390 Urinary tract infection, site not specified: Secondary | ICD-10-CM

## 2013-11-28 DIAGNOSIS — R350 Frequency of micturition: Secondary | ICD-10-CM

## 2013-11-28 LAB — POCT URINALYSIS DIPSTICK
BILIRUBIN UA: NEGATIVE
Blood, UA: NEGATIVE
Glucose, UA: NEGATIVE
KETONES UA: NEGATIVE
Nitrite, UA: NEGATIVE
PH UA: 6
Spec Grav, UA: 1.01
Urobilinogen, UA: 0.2

## 2013-11-28 MED ORDER — CIPROFLOXACIN HCL 500 MG PO TABS: 500.0000 mg | ORAL_TABLET | Freq: Two times a day (BID) | ORAL | Status: DC

## 2013-11-28 NOTE — Progress Notes (Signed)
   Subjective:    Patient ID: Catherine Hamilton, female    DOB: 1990-09-21, 23 y.o.   MRN: [id number]  HPI Here for recurrent UTIs. She has a long hx of this and in June 2013 she saw Dr. Vernie Ammonsttelin for a urologic evaluation. She had 2 small right kidney stones, but these were not felt to contriute to the problem at all. No obstruction was found to her urine outflow. She has had documented infections with at least 3 different bacteria species in the past few years including E coli, Klebsiella, and Proteus. She was put on daily Macrobid for a year and she did well with no infections. Then this stopped this summer and now she has another UTI. She has urgency and burning. Some nausea but no vomiting, no fever. Drinking lots of water. She contacted her GYN clinic and a week of Bactrim DS was called in. She finished this last week and it did not help at all.    Review of Systems  Constitutional: Negative.   Genitourinary: Positive for dysuria, urgency and frequency. Negative for hematuria and flank pain.       Objective:   Physical Exam  Constitutional: She appears well-developed and well-nourished.  Abdominal: Soft. Bowel sounds are normal. She exhibits no distension and no mass. There is no tenderness. There is no rebound and no guarding.          Assessment & Plan:  Treat with Cipro this time, culture the sample. Advised her to follow up with Dr. Vernie Ammonsttelin soon.

## 2013-11-28 NOTE — Progress Notes (Signed)
Pre visit review using our clinic review tool, if applicable. No additional management support is needed unless otherwise documented below in the visit note. 

## 2013-11-28 NOTE — Telephone Encounter (Signed)
Patient Information:  Caller Name: Graciella BeltonDianne  Phone: [phone]  Patient: Catherine Hamilton, Catherine Hamilton  Gender: Female  DOB: [birth year]  Age: 89 Years  PCP: Selena BattenKim (TEXT 1st, after 20 mins can call), Dahlia ClientHannah [facility](Family Practice)  Pregnant: No  Office Follow Up:  Does the office need to follow up with this patient?: No  Instructions For The Office: N/A  RN Note:  Implanon removed 07/31/13.  Had one menses at the end of May. Described severe pain when voids.  Urine color is dark amber. Has not seen urologist due to no insurance.  Urology office ordered Bactim DS 11/15/13 but not the antibiotic Shrinika wanted so she did not get it filled.  Also noted Diflucan ordered 11/19/13 per EMR. Declined next available appointment due to work.  Unable to come in until 1130 or after. Called Norma/office scheduler for appointment out of order.  Nelva Bushorma discussed request with [place] who said it is OK to schedule out of order.   Symptoms  Reason For Call & Symptoms: Requeting antibiotic for sympotms of ITI including dysuria, frequency, urgency and low back ache for past 3 weeks.  Reviewed Health History In EMR: N/A  Reviewed Medications In EMR: N/A  Reviewed Allergies In EMR: Yes  Reviewed Surgeries / Procedures: Yes  Date of Onset of Symptoms: 11/07/2013  Treatments Tried: increased water, Azo-standard  Treatments Tried Worked: No OB / GYN:  LMP: 09/30/2013  Guideline(s) Used:  Urination Pain - Female  Disposition Per Guideline:   Go to Office Now  Reason For Disposition Reached:   Severe pain with urination  Advice Given:  Fluids:   Drink extra fluids. Drink 8-10 glasses of liquids a day (Reason: to produce a dilute, non-irritating urine).  Cranberry Juice:   Some people think that drinking cranberry juice may help in fighting urinary tract infections. However, there is no good research that has ever proved this.  Dosage Cranberry Juice Cocktail: 8 oz (240 ml) twice a day.  Dosage 100% Cranberry Juice: 1 oz (30 ml)  twice a day.  Warm Saline SITZ Baths to Reduce Pain:  Sit in a warm saline bath for 20 minutes to cleanse the area and to reduce pain. Add 2 oz. of table salt or baking soda to a tub of water.  Warm Saline SITZ Baths to Reduce Pain:  Sit in a warm saline bath for 20 minutes to cleanse the area and to reduce pain. Add 2 oz. of table salt or baking soda to a tub of water.  Call Back If:  You become worse.  Appointment Scheduled:  11/28/2013 11:30:00 Appointment Scheduled Provider:  Gershon CraneFry, [facility](Family Practice)

## 2013-11-28 NOTE — Telephone Encounter (Signed)
Noted  

## 2013-11-30 LAB — URINE CULTURE
COLONY COUNT: NO GROWTH
Organism ID, Bacteria: NO GROWTH

## 2014-02-26 ENCOUNTER — Encounter: Payer: Self-pay | Admitting: Family Medicine

## 2014-02-26 ENCOUNTER — Ambulatory Visit (INDEPENDENT_AMBULATORY_CARE_PROVIDER_SITE_OTHER): Payer: BC Managed Care – PPO | Admitting: Family Medicine

## 2014-02-26 ENCOUNTER — Encounter: Payer: Self-pay | Admitting: *Deleted

## 2014-02-26 ENCOUNTER — Telehealth: Payer: Self-pay | Admitting: *Deleted

## 2014-02-26 VITALS — BP 104/74 | HR 76 | Temp 98.2°F | Ht 61.0 in | Wt 124.6 lb

## 2014-02-26 DIAGNOSIS — J029 Acute pharyngitis, unspecified: Secondary | ICD-10-CM

## 2014-02-26 LAB — POCT RAPID STREP A (OFFICE): Rapid Strep A Screen: NEGATIVE

## 2014-02-26 MED ORDER — AMOXICILLIN 875 MG PO TABS: 875.0000 mg | ORAL_TABLET | Freq: Two times a day (BID) | ORAL | Status: DC

## 2014-02-26 NOTE — Addendum Note (Signed)
Addended by: Johnella MoloneyFUNDERBURK, JO A on: 02/26/2014 12:01 PM   Modules accepted: Orders

## 2014-02-26 NOTE — Telephone Encounter (Signed)
Patient was seen today and given a note to return to work on 10/28.  She requested to be out of work until Friday-10/30.  Please call pt at [phone].

## 2014-02-26 NOTE — Telephone Encounter (Signed)
Called pt. I advise return to work ok after treatment with antibiotic and/or afebrile for 24 hours. Ok for note to state this. She wants new note with the fever information included - can you please add this per above and call to let her know when ready? Thanks.

## 2014-02-26 NOTE — Patient Instructions (Signed)
BEFORE YOU LEAVE: -work note  Pharyngitis Pharyngitis is redness, pain, and swelling (inflammation) of your pharynx.  CAUSES  Pharyngitis is usually caused by infection. Most of the time, these infections are from viruses (viral) and are part of a cold. However, sometimes pharyngitis is caused by bacteria (bacterial). Pharyngitis can also be caused by allergies. Viral pharyngitis may be spread from person to person by coughing, sneezing, and personal items or utensils (cups, forks, spoons, toothbrushes). Bacterial pharyngitis may be spread from person to person by more intimate contact, such as kissing.  SIGNS AND SYMPTOMS  Symptoms of pharyngitis include:   Sore throat.   Tiredness (fatigue).   Low-grade fever.   Headache.  Joint pain and muscle aches.  Skin rashes.  Swollen lymph nodes.  Plaque-like film on throat or tonsils (often seen with bacterial pharyngitis). DIAGNOSIS  Your health care provider will ask you questions about your illness and your symptoms. Your medical history, along with a physical exam, is often all that is needed to diagnose pharyngitis. Sometimes, a rapid strep test is done. Other lab tests may also be done, depending on the suspected cause.  TREATMENT  Viral pharyngitis will usually get better in 3-4 days without the use of medicine. Bacterial pharyngitis is treated with medicines that kill germs (antibiotics).  HOME CARE INSTRUCTIONS   Drink enough water and fluids to keep your urine clear or pale yellow.   Only take over-the-counter or prescription medicines as directed by your health care provider:   If you are prescribed antibiotics, make sure you finish them even if you start to feel better.   Do not take aspirin.   Get lots of rest.   Gargle with 8 oz of salt water ( tsp of salt per 1 qt of water) as often as every 1-2 hours to soothe your throat.   Throat lozenges (if you are not at risk for choking) or sprays may be used to  soothe your throat. SEEK MEDICAL CARE IF:   You have large, tender lumps in your neck.  You have a rash.  You cough up green, yellow-brown, or bloody spit. SEEK IMMEDIATE MEDICAL CARE IF:   Your neck becomes stiff.  You drool or are unable to swallow liquids.  You vomit or are unable to keep medicines or liquids down.  You have severe pain that does not go away with the use of recommended medicines.  You have trouble breathing (not caused by a stuffy nose). MAKE SURE YOU:   Understand these instructions.  Will watch your condition.  Will get help right away if you are not doing well or get worse. Document Released: 04/19/2005 Document Revised: 02/07/2013 Document Reviewed: 12/25/2012 [facility] Patient Information 2015 [employer]. This information is not intended to replace advice given to you by your health care provider. Make sure you discuss any questions you have with your health care provider.
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
## 2014-02-26 NOTE — Telephone Encounter (Signed)
Note completed and I left a detailed message on the pts cell number this was left at the front desk for her to pick this up.

## 2014-02-26 NOTE — Progress Notes (Signed)
Pre visit review using our clinic review tool, if applicable. No additional management support is needed unless otherwise documented below in the visit note. 

## 2014-03-04 ENCOUNTER — Encounter: Payer: Self-pay | Admitting: Family Medicine

## 2014-03-07 ENCOUNTER — Other Ambulatory Visit: Payer: BC Managed Care – PPO

## 2014-03-11 ENCOUNTER — Other Ambulatory Visit: Payer: BC Managed Care – PPO

## 2014-03-16 ENCOUNTER — Other Ambulatory Visit: Payer: Self-pay | Admitting: Family Medicine

## 2014-03-16 ENCOUNTER — Encounter: Payer: Self-pay | Admitting: Family Medicine

## 2014-03-16 ENCOUNTER — Ambulatory Visit (INDEPENDENT_AMBULATORY_CARE_PROVIDER_SITE_OTHER): Payer: BC Managed Care – PPO | Admitting: Family Medicine

## 2014-03-16 VITALS — BP 110/64 | Temp 98.3°F | Wt 125.0 lb

## 2014-03-16 DIAGNOSIS — R1030 Lower abdominal pain, unspecified: Secondary | ICD-10-CM

## 2014-03-16 DIAGNOSIS — R300 Dysuria: Secondary | ICD-10-CM

## 2014-03-16 DIAGNOSIS — N390 Urinary tract infection, site not specified: Secondary | ICD-10-CM

## 2014-03-16 LAB — POCT URINALYSIS DIPSTICK
BILIRUBIN UA: NEGATIVE
Glucose, UA: NEGATIVE
Ketones, UA: NEGATIVE
Leukocytes, UA: NEGATIVE
Nitrite, UA: NEGATIVE
PH UA: 5
RBC UA: NEGATIVE
Spec Grav, UA: 1.02
Urobilinogen, UA: NEGATIVE

## 2014-03-16 LAB — POCT URINE PREGNANCY: Preg Test, Ur: NEGATIVE

## 2014-03-16 MED ORDER — SULFAMETHOXAZOLE-TRIMETHOPRIM 800-160 MG PO TABS: 1.0000 | ORAL_TABLET | Freq: Two times a day (BID) | ORAL | Status: DC

## 2014-03-16 NOTE — Patient Instructions (Signed)
Drink plenty of water and start the antibiotics today.  We'll contact you with your lab report.  Take care.   

## 2014-03-16 NOTE — Progress Notes (Signed)
She has h/o frequent UTI.  This feels similar to prev.  Pain with urination, stinging.  Urinary urgency.  Lower abd pain.  This episode started in the last 3 days . upreg neg, d/w pt.  U/a d/w pt.   She has f/u with urology pending.   No back pain.  No fevers.  No vomiting.    Meds, vitals, and allergies reviewed.   ROS: See HPI.  Otherwise, noncontributory.  GEN: nad, alert and oriented HEENT: mucous membranes moist NECK: supple CV: rrr.  PULM: ctab, no inc wob ABD: soft, +bs, suprapubic area tender EXT: no edema SKIN: no acute rash BACK: no CVA pain

## 2014-03-16 NOTE — Assessment & Plan Note (Signed)
ucx pending.  Septra, fluids.  Nontoxic.  She'll check on f/u with uro.  She agrees with plan.

## 2014-03-18 ENCOUNTER — Telehealth: Payer: Self-pay | Admitting: Family Medicine

## 2014-03-18 ENCOUNTER — Ambulatory Visit: Payer: Self-pay | Admitting: Family Medicine

## 2014-03-18 NOTE — Telephone Encounter (Signed)
I called the pt and advised her per Dr Selena BattenKim she should await the culture results and take the Bactrim that was given as this is a strong antibiotic and she would be glad to see her but there is not much more she could do for her here.  However I advised her per Dr Selena BattenKim if she feels worse she should go to the ER if she develops a fever or back pain, call the urologist now for their recommendation and she agreed.  I cancelled her appt for today.

## 2014-03-18 NOTE — Telephone Encounter (Signed)
Noted  

## 2014-03-18 NOTE — Telephone Encounter (Signed)
Patient Information:  Caller Name: Graciella BeltonDianne  Phone: [phone]  Patient: Catherine Hamilton, Catherine Hamilton  Gender: Female  DOB: [birth year]  Age: [age] Years  PCP: Selena BattenKim (TEXT 1st, after 20 mins can call), [facility]  Pregnant: No  Office Follow Up:  Does the office need to follow up with this patient?: Yes  Instructions For The Office: Please f/u with pt concerning UTI and on antibiotic since 03/16/14 symptoms not improved, pt would like to have another Rx called in if possible, thank you.   Symptoms  Reason For Call & Symptoms: Pt reports she was seen Sat 03/16/14 and diagnosed with UTI given medication Bacrtrim and symptoms have not improved.  Pain with urination, frequency, urgency.  Reviewed Health History In EMR: Yes  Reviewed Medications In EMR: Yes  Reviewed Allergies In EMR: Yes  Reviewed Surgeries / Procedures: Yes  Date of Onset of Symptoms: 03/16/2014 OB / GYN:  LMP: 03/04/2014  Guideline(s) Used:  Urination Pain - Female  Disposition Per Guideline:   See Today in Office  Reason For Disposition Reached:   Painful urination AND EITHER frequency or urgency  Advice Given:  Call Back If:  You become worse.  Patient Will Follow Care Advice:  YES  Appointment Scheduled:  03/18/2014 14:15:00 Appointment Scheduled Provider:  Selena BattenKim (TEXT 1st, after 20 mins can call), [facility]

## 2014-03-18 NOTE — Telephone Encounter (Signed)
I also advised we could change to a different abx if she feelt needed, but she is going to check with urologist per discussion with my assistant whom spoke with her.

## 2014-03-19 ENCOUNTER — Other Ambulatory Visit: Payer: Self-pay | Admitting: Family Medicine

## 2014-03-19 LAB — URINE CULTURE

## 2014-03-19 MED ORDER — CEPHALEXIN 500 MG PO CAPS: 500.0000 mg | ORAL_CAPSULE | Freq: Two times a day (BID) | ORAL | Status: DC

## 2014-04-16 ENCOUNTER — Ambulatory Visit: Payer: BC Managed Care – PPO | Admitting: Obstetrics

## 2014-05-03 NOTE — L&D Delivery Note (Signed)
Delivery Note At 9:39 PM a viable and healthy female was delivered via Vaginal, Spontaneous Delivery (Presentation: Left Occiput Anterior).  APGAR: 8, 10; weight  pending.   Placenta status: Intact, Spontaneous.  Cord: 3 vessels with the following complications: None.  Anesthesia: Epidural  Episiotomy: None Lacerations: None Suture Repair: NA Est. Blood Loss (mL): 250  Mom to postpartum.  Baby to Couplet care / Skin to Skin.  Shanyia Stines H. 01/23/2015, 10:12 PM

## 2014-05-29 ENCOUNTER — Encounter: Payer: Self-pay | Admitting: Internal Medicine

## 2014-05-29 ENCOUNTER — Ambulatory Visit (INDEPENDENT_AMBULATORY_CARE_PROVIDER_SITE_OTHER): Payer: 59 | Admitting: Internal Medicine

## 2014-05-29 ENCOUNTER — Other Ambulatory Visit: Payer: Self-pay | Admitting: Internal Medicine

## 2014-05-29 ENCOUNTER — Other Ambulatory Visit (INDEPENDENT_AMBULATORY_CARE_PROVIDER_SITE_OTHER): Payer: 59

## 2014-05-29 VITALS — BP 102/80 | HR 82 | Temp 98.3°F | Ht 61.0 in | Wt 124.2 lb

## 2014-05-29 DIAGNOSIS — N926 Irregular menstruation, unspecified: Secondary | ICD-10-CM

## 2014-05-29 DIAGNOSIS — R300 Dysuria: Secondary | ICD-10-CM

## 2014-05-29 DIAGNOSIS — Z3201 Encounter for pregnancy test, result positive: Secondary | ICD-10-CM

## 2014-05-29 DIAGNOSIS — Z3490 Encounter for supervision of normal pregnancy, unspecified, unspecified trimester: Secondary | ICD-10-CM

## 2014-05-29 DIAGNOSIS — N390 Urinary tract infection, site not specified: Secondary | ICD-10-CM

## 2014-05-29 LAB — URINALYSIS, ROUTINE W REFLEX MICROSCOPIC
Bilirubin Urine: NEGATIVE
Hgb urine dipstick: NEGATIVE
KETONES UR: NEGATIVE
Leukocytes, UA: NEGATIVE
Nitrite: NEGATIVE
PH: 5.5 (ref 5.0–8.0)
Specific Gravity, Urine: >=1.03 — AB (ref 1.000–1.030)
TOTAL PROTEIN, URINE-UPE24: NEGATIVE
URINE GLUCOSE: NEGATIVE
UROBILINOGEN UA: 0.2 (ref 0.0–1.0)

## 2014-05-29 LAB — HCG, QUANTITATIVE, PREGNANCY: QUANTITATIVE HCG: 218.6 m[IU]/mL

## 2014-05-29 MED ORDER — CEPHALEXIN 500 MG PO CAPS: 500.0000 mg | ORAL_CAPSULE | Freq: Three times a day (TID) | ORAL | Status: DC

## 2014-05-29 NOTE — Patient Instructions (Signed)
Please take all new medication as prescribed  Your culture results should be available in 2-3 days  Please continue all other medications as before, and refills have been done if requested.  Please have the pharmacy call with any other refills you may need.  Please keep your appointments with your specialists as you may have planned
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
## 2014-05-29 NOTE — Progress Notes (Signed)
Pre visit review using our clinic review tool, if applicable. No additional management support is needed unless otherwise documented below in the visit note. 

## 2014-05-30 ENCOUNTER — Ambulatory Visit: Payer: Medicaid Other | Admitting: Family Medicine

## 2014-05-31 LAB — URINE CULTURE: Colony Count: >=100000

## 2014-06-01 NOTE — Assessment & Plan Note (Addendum)
With recurrence by exam ,UA dip abnormal, for urine cx, empiric tx with antibiotic,  to f/u any worsening symptoms or concerns

## 2014-06-01 NOTE — Assessment & Plan Note (Signed)
Serum bhcg + - for OB referral after d/w pt and husband

## 2014-06-04 ENCOUNTER — Encounter: Payer: Self-pay | Admitting: Internal Medicine

## 2014-06-18 ENCOUNTER — Encounter (HOSPITAL_COMMUNITY): Payer: Self-pay | Admitting: *Deleted

## 2014-06-18 ENCOUNTER — Inpatient Hospital Stay (HOSPITAL_COMMUNITY)
Admission: AD | Admit: 2014-06-18 | Discharge: 2014-06-18 | Disposition: A | Discharge status: Home / Self Care | Payer: Medicaid Other | Source: Ambulatory Visit | Attending: Obstetrics and Gynecology | Admitting: Obstetrics and Gynecology

## 2014-06-18 DIAGNOSIS — O219 Vomiting of pregnancy, unspecified: Secondary | ICD-10-CM

## 2014-06-18 DIAGNOSIS — O210 Mild hyperemesis gravidarum: Secondary | ICD-10-CM | POA: Diagnosis present

## 2014-06-18 DIAGNOSIS — Z3A01 Less than 8 weeks gestation of pregnancy: Secondary | ICD-10-CM | POA: Insufficient documentation

## 2014-06-18 DIAGNOSIS — Z3A08 8 weeks gestation of pregnancy: Secondary | ICD-10-CM

## 2014-06-18 LAB — URINALYSIS, ROUTINE W REFLEX MICROSCOPIC
BILIRUBIN URINE: NEGATIVE
Glucose, UA: NEGATIVE mg/dL
HGB URINE DIPSTICK: NEGATIVE
Leukocytes, UA: NEGATIVE
NITRITE: NEGATIVE
Protein, ur: NEGATIVE mg/dL
SPECIFIC GRAVITY, URINE: 1.025 (ref 1.005–1.030)
UROBILINOGEN UA: 0.2 mg/dL (ref 0.0–1.0)
pH: 6.5 (ref 5.0–8.0)

## 2014-06-18 MED ORDER — PROMETHAZINE HCL 25 MG/ML IJ SOLN
25.0000 mg | Freq: Once | INTRAMUSCULAR | Status: AC
  Administered 2014-06-18: 25 mg via INTRAVENOUS
  Filled 2014-06-18: qty 1

## 2014-06-18 MED ORDER — PROMETHAZINE HCL 25 MG PO TABS: 25.0000 mg | ORAL_TABLET | Freq: Four times a day (QID) | ORAL | Status: DC | PRN

## 2014-06-18 MED ORDER — ONDANSETRON HCL 4 MG PO TABS: 4.0000 mg | ORAL_TABLET | Freq: Four times a day (QID) | ORAL | Status: DC

## 2014-06-18 NOTE — MAU Note (Signed)
PT  SAYS SHE STARTED  VOMITING  2  WEEKS  AGO-  ALL DAY.    SAW DR Langston MaskerMORRIS  ON 2-11- GAVE HER  DICLEGIS -  SHE TOOK  MED YESTERDAY .  NO PHENERGAN   .
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
## 2014-06-18 NOTE — MAU Note (Signed)
Pt reports she has not been able to keep anything down for the last 6 days. meds are not helping.

## 2014-06-18 NOTE — Discharge Instructions (Signed)
Morning Sickness °Morning sickness is when you feel sick to your stomach (nauseous) during pregnancy. You may feel sick to your stomach and throw up (vomit). You may feel sick in the morning, but you can feel this way any time of day. Some women feel very sick to their stomach and cannot stop throwing up (hyperemesis gravidarum). °HOME CARE °· Only take medicines as told by your doctor. °· Take multivitamins as told by your doctor. Taking multivitamins before getting pregnant can stop or lessen the harshness of morning sickness. °· Eat dry toast or unsalted crackers before getting out of bed. °· Eat 5 to 6 small meals a day. °· Eat dry and bland foods like rice and baked potatoes. °· Do not drink liquids with meals. Drink between meals. °· Do not eat greasy, fatty, or spicy foods. °· Have someone cook for you if the smell of food causes you to feel sick or throw up. °· If you feel sick to your stomach after taking prenatal vitamins, take them at night or with a snack. °· Eat protein when you need a snack (nuts, yogurt, cheese). °· Eat unsweetened gelatins for dessert. °· Wear a bracelet used for sea sickness (acupressure wristband). °· Go to a doctor that puts thin needles into certain body points (acupuncture) to improve how you feel. °· Do not smoke. °· Use a humidifier to keep the air in your house free of odors. °· Get lots of fresh air. °GET HELP IF: °· You need medicine to feel better. °· You feel dizzy or lightheaded. °· You are losing weight. °GET HELP RIGHT AWAY IF:  °· You feel very sick to your stomach and cannot stop throwing up. °· You pass out (faint). °MAKE SURE YOU: °· Understand these instructions. °· Will watch your condition. °· Will get help right away if you are not doing well or get worse. °Document Released: 05/27/2004 Document Revised: 04/24/2013 Document Reviewed: 10/04/2012 °ExitCare® Patient Information ©2015 [employer]. This information is not intended to replace advice given to you by  your health care provider. Make sure you discuss any questions you have with your health care provider. ° °Eating Plan for Hyperemesis Gravidarum °Severe cases of hyperemesis gravidarum can lead to dehydration and malnutrition. The hyperemesis eating plan is one way to lessen the symptoms of nausea and vomiting. It is often used with prescribed medicines to control your symptoms.  °WHAT CAN I DO TO RELIEVE MY SYMPTOMS? °Listen to your body. Everyone is different and has different preferences. Find what works best for you. Some of the following things may help: °· Eat and drink slowly. °· Eat 5-6 small meals daily instead of 3 large meals.   °· Eat crackers before you get out of bed in the morning.   °· Starchy foods are usually well tolerated (such as cereal, toast, bread, potatoes, pasta, rice, and pretzels).   °· Ginger may help with nausea. Add ¼ tsp ground ginger to hot tea or choose ginger tea.   °· Try drinking 100% fruit juice or an electrolyte drink. °· Continue to take your prenatal vitamins as directed by your health care provider. If you are having trouble taking your prenatal vitamins, talk with your health care provider about different options. °· Include at least 1 serving of protein with your meals and snacks (such as meats or poultry, beans, nuts, eggs, or yogurt). Try eating a protein-rich snack before bed (such as cheese and crackers or a half turkey or peanut butter sandwich). °WHAT THINGS SHOULD I   AVOID TO REDUCE MY SYMPTOMS? °The following things may help reduce your symptoms: °· Avoid foods with strong smells. Try eating meals in well-ventilated areas that are free of odors. °· Avoid drinking water or other beverages with meals. Try not to drink anything less than 30 minutes before and after meals. °· Avoid drinking more than 1 cup of fluid at a time. °· Avoid fried or high-fat foods, such as butter and cream sauces. °· Avoid spicy foods. °· Avoid skipping meals the best you can. Nausea can be  more intense on an empty stomach. If you cannot tolerate food at that time, do not force it. Try sucking on ice chips or other frozen items and make up the calories later. °· Avoid lying down within 2 hours after eating. °Document Released: 02/14/2007 Document Revised: 04/24/2013 Document Reviewed: 02/21/2013 °ExitCare® Patient Information ©2015 [employer]. This information is not intended to replace advice given to you by your health care provider. Make sure you discuss any questions you have with your health care provider. ° °

## 2014-06-26 ENCOUNTER — Inpatient Hospital Stay (HOSPITAL_COMMUNITY)
Admission: AD | Admit: 2014-06-26 | Discharge: 2014-06-27 | Disposition: A | Discharge status: Home / Self Care | Payer: Medicaid Other | Source: Ambulatory Visit | Attending: Obstetrics and Gynecology | Admitting: Obstetrics and Gynecology

## 2014-06-26 ENCOUNTER — Encounter (HOSPITAL_COMMUNITY): Payer: Self-pay | Admitting: *Deleted

## 2014-06-26 DIAGNOSIS — Z3A08 8 weeks gestation of pregnancy: Secondary | ICD-10-CM | POA: Diagnosis not present

## 2014-06-26 DIAGNOSIS — N390 Urinary tract infection, site not specified: Secondary | ICD-10-CM | POA: Diagnosis not present

## 2014-06-26 DIAGNOSIS — O210 Mild hyperemesis gravidarum: Secondary | ICD-10-CM | POA: Diagnosis present

## 2014-06-26 DIAGNOSIS — O219 Vomiting of pregnancy, unspecified: Secondary | ICD-10-CM

## 2014-06-26 DIAGNOSIS — N3000 Acute cystitis without hematuria: Secondary | ICD-10-CM

## 2014-06-26 LAB — URINE MICROSCOPIC-ADD ON

## 2014-06-26 LAB — URINALYSIS, ROUTINE W REFLEX MICROSCOPIC
Bilirubin Urine: NEGATIVE
Glucose, UA: NEGATIVE mg/dL
HGB URINE DIPSTICK: NEGATIVE
Ketones, ur: NEGATIVE mg/dL
Leukocytes, UA: NEGATIVE
Nitrite: POSITIVE — AB
PH: 6 (ref 5.0–8.0)
Protein, ur: NEGATIVE mg/dL
Specific Gravity, Urine: 1.025 (ref 1.005–1.030)
Urobilinogen, UA: 0.2 mg/dL (ref 0.0–1.0)
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
## 2014-06-26 NOTE — MAU Note (Signed)
Pt states her MD switched her meds after she was seen here and the meds are not working. Vomiting all day.

## 2014-06-27 DIAGNOSIS — Z3A08 8 weeks gestation of pregnancy: Secondary | ICD-10-CM

## 2014-06-27 DIAGNOSIS — O219 Vomiting of pregnancy, unspecified: Secondary | ICD-10-CM

## 2014-06-27 MED ORDER — LACTATED RINGERS IV BOLUS (SEPSIS)
1000.0000 mL | Freq: Once | INTRAVENOUS | Status: AC
  Administered 2014-06-27: 1000 mL via INTRAVENOUS

## 2014-06-27 MED ORDER — ONDANSETRON HCL 4 MG/2ML IJ SOLN
4.0000 mg | Freq: Once | INTRAMUSCULAR | Status: AC
  Administered 2014-06-27: 4 mg via INTRAVENOUS
  Filled 2014-06-27: qty 2

## 2014-06-27 MED ORDER — CEPHALEXIN 500 MG PO CAPS: 500.0000 mg | ORAL_CAPSULE | Freq: Four times a day (QID) | ORAL | Status: DC

## 2014-06-27 NOTE — Discharge Instructions (Signed)
Eating Plan for Hyperemesis Gravidarum Severe cases of hyperemesis gravidarum can lead to dehydration and malnutrition. The hyperemesis eating plan is one way to lessen the symptoms of nausea and vomiting. It is often used with prescribed medicines to control your symptoms.  WHAT CAN I DO TO RELIEVE MY SYMPTOMS? Listen to your body. Everyone is different and has different preferences. Find what works best for you. Some of the following things may help:  Eat and drink slowly.  Eat 5-6 small meals daily instead of 3 large meals.   Eat crackers before you get out of bed in the morning.   Starchy foods are usually well tolerated (such as cereal, toast, bread, potatoes, pasta, rice, and pretzels).   Ginger may help with nausea. Add  tsp ground ginger to hot tea or choose ginger tea.   Try drinking 100% fruit juice or an electrolyte drink.  Continue to take your prenatal vitamins as directed by your health care provider. If you are having trouble taking your prenatal vitamins, talk with your health care provider about different options.  Include at least 1 serving of protein with your meals and snacks (such as meats or poultry, beans, nuts, eggs, or yogurt). Try eating a protein-rich snack before bed (such as cheese and crackers or a half [place]urkey or peanut butter sandwich). WHAT THINGS SHOULD I AVOID TO REDUCE MY SYMPTOMS? The following things may help reduce your symptoms:  Avoid foods with strong smells. Try eating meals in well-ventilated areas that are free of odors.  Avoid drinking water or other beverages with meals. Try not to drink anything less than 30 minutes before and after meals.  Avoid drinking more than 1 cup of fluid at a time.  Avoid fried or high-fat foods, such as butter and cream sauces.  Avoid spicy foods.  Avoid skipping meals the best you can. Nausea can be more intense on an empty stomach. If you cannot tolerate food at that time, do not force it. Try sucking on  ice chips or other frozen items and make up the calories later.  Avoid lying down within 2 hours after eating. Document Released: 02/14/2007 Document Revised: 04/24/2013 Document Reviewed: 02/21/2013 [facility]Exit[facility] Patient Information 2015 [place]Care, [place]LL[place]. This information is not intended to replace advice given to you by your health care provider. Make sure you discuss any questions you have with your health care provider. Asymptomatic Bacteriuria Asymptomatic bacteriuria is the presence of a large number of bacteria in your urine without the usual symptoms of burning or frequent urination. The following conditions increase the risk of asymptomatic bacteriuria:  Diabetes mellitus.  Advanced age.  Pregnancy in the first trimester.  Kidney stones.  Kidney transplants.  Leaky kidney tube valve in young children (reflux). Treatment for this condition is not needed in most people and can lead to other problems such as too much yeast and growth of resistant bacteria. However, some people, such as pregnant women, do need treatment to prevent kidney infection. Asymptomatic bacteriuria in pregnancy is also associated with fetal growth restriction, premature labor, and newborn death. HOME CARE INSTRUCTIONS Monitor your condition for any changes. The following actions may help to relieve any discomfort you are feeling:  Drink enough water and fluids to keep your urine clear or pale yellow. Go to the bathroom more often to keep your bladder empty.  Keep the area around your vagina and rectum clean. Wipe yourself from front to back after urinating. SEEK IMMEDIATE MEDICAL CARE IF:  You develop signs of  an infection such as:  Burning with urination.  Frequency of voiding.  Back pain.  Fever.  You have blood in the urine.  You develop a fever. MAKE SURE YOU:  Understand these instructions.  Will watch your condition.  Will get help right away if you are not doing well or get  worse. Document Released: 04/19/2005 Document Revised: 09/03/2013 Document Reviewed: 10/09/2012 [facility] CenterExit[facility] Patient Information 2015 [place]ExitCare, [place]LLC. This information is not intended to replace advice given to you by your health care provider. Make sure you discuss any questions you have with your health care provider.

## 2014-06-30 ENCOUNTER — Encounter (HOSPITAL_COMMUNITY): Payer: Self-pay | Admitting: *Deleted

## 2014-06-30 ENCOUNTER — Inpatient Hospital Stay (HOSPITAL_COMMUNITY)
Admission: AD | Admit: 2014-06-30 | Discharge: 2014-06-30 | Disposition: A | Discharge status: Home / Self Care | Payer: Medicaid Other | Source: Ambulatory Visit | Attending: Obstetrics and Gynecology | Admitting: Obstetrics and Gynecology

## 2014-06-30 DIAGNOSIS — Z87442 Personal history of urinary calculi: Secondary | ICD-10-CM | POA: Insufficient documentation

## 2014-06-30 DIAGNOSIS — Z3A09 9 weeks gestation of pregnancy: Secondary | ICD-10-CM | POA: Insufficient documentation

## 2014-06-30 DIAGNOSIS — O210 Mild hyperemesis gravidarum: Secondary | ICD-10-CM | POA: Insufficient documentation

## 2014-06-30 DIAGNOSIS — Z3A10 10 weeks gestation of pregnancy: Secondary | ICD-10-CM

## 2014-06-30 DIAGNOSIS — O219 Vomiting of pregnancy, unspecified: Secondary | ICD-10-CM

## 2014-06-30 HISTORY — DX: Unspecified convulsions: R56.9

## 2014-06-30 LAB — URINALYSIS, ROUTINE W REFLEX MICROSCOPIC
BILIRUBIN URINE: NEGATIVE
GLUCOSE, UA: NEGATIVE mg/dL
Hgb urine dipstick: NEGATIVE
Ketones, ur: 15 mg/dL — AB
Leukocytes, UA: NEGATIVE
Nitrite: NEGATIVE
Protein, ur: NEGATIVE mg/dL
Specific Gravity, Urine: 1.02 (ref 1.005–1.030)
Urobilinogen, UA: 0.2 mg/dL (ref 0.0–1.0)
pH: 7 (ref 5.0–8.0)

## 2014-06-30 MED ORDER — CEFTRIAXONE SODIUM IN DEXTROSE 20 MG/ML IV SOLN
1.0000 g | Freq: Once | INTRAVENOUS | Status: AC
  Administered 2014-06-30: 1 g via INTRAVENOUS
  Filled 2014-06-30: qty 50

## 2014-06-30 MED ORDER — SODIUM CHLORIDE 0.9 % IV SOLN
25.0000 mg | Freq: Once | INTRAVENOUS | Status: AC
  Administered 2014-06-30: 25 mg via INTRAVENOUS
  Filled 2014-06-30: qty 1
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
## 2014-06-30 NOTE — Discharge Instructions (Signed)
Hyperemesis Gravidarum °Hyperemesis gravidarum is a severe form of nausea and vomiting that happens during pregnancy. Hyperemesis is worse than morning sickness. It may cause you to have nausea or vomiting all day for many days. It may keep you from eating and drinking enough food and liquids. Hyperemesis usually occurs during the first half (the first 20 weeks) of pregnancy. It often goes away once a woman is in her second half of pregnancy. However, sometimes hyperemesis continues through an entire pregnancy.  °CAUSES  °The cause of this condition is not completely known but is thought to be related to changes in the body's hormones when pregnant. It could be from the high level of the pregnancy hormone or an increase in estrogen in the body.  °SIGNS AND SYMPTOMS  °· Severe nausea and vomiting. °· Nausea that does not go away. °· Vomiting that does not allow you to keep any food down. °· Weight loss and body fluid loss (dehydration). °· Having no desire to eat or not liking food you have previously enjoyed. °DIAGNOSIS  °Your health care provider will do a physical exam and ask you about your symptoms. He or she may also order blood tests and urine tests to make sure something else is not causing the problem.  °TREATMENT  °You may only need medicine to control the problem. If medicines do not control the nausea and vomiting, you will be treated in the hospital to prevent dehydration, increased acid in the blood (acidosis), weight loss, and changes in the electrolytes in your body that may harm the unborn baby (fetus). You may need IV fluids.  °HOME CARE INSTRUCTIONS  °· Only take over-the-counter or prescription medicines as directed by your health care provider. °· Try eating a couple of dry crackers or toast in the morning before getting out of bed. °· Avoid foods and smells that upset your stomach. °· Avoid fatty and spicy foods. °· Eat 5-6 small meals a day. °· Do not drink when eating meals. Drink between  meals. °· For snacks, eat high-protein foods, such as cheese. °· Eat or suck on things that have ginger in them. Ginger helps nausea. °· Avoid food preparation. The smell of food can spoil your appetite. °· Avoid iron pills and iron in your multivitamins until after 3-4 months of being pregnant. However, consult with your health care provider before stopping any prescribed iron pills. °SEEK MEDICAL CARE IF:  °· Your abdominal pain increases. °· You have a severe headache. °· You have vision problems. °· You are losing weight. °SEEK IMMEDIATE MEDICAL CARE IF:  °· You are unable to keep fluids down. °· You vomit blood. °· You have constant nausea and vomiting. °· You have excessive weakness. °· You have extreme thirst. °· You have dizziness or fainting. °· You have a fever or persistent symptoms for more than 2-3 days. °· You have a fever and your symptoms suddenly get worse. °MAKE SURE YOU:  °· Understand these instructions. °· Will watch your condition. °· Will get help right away if you are not doing well or get worse. °Document Released: 04/19/2005 Document Revised: 02/07/2013 Document Reviewed: 11/29/2012 °ExitCare® Patient Information ©2015 Exit[employer], LLC. This information is not intended to replace advice given to you by your health care provider. Make sure you discuss any questions you have with your health care provider. ° °

## 2014-07-03 ENCOUNTER — Encounter (HOSPITAL_COMMUNITY): Payer: Self-pay | Admitting: *Deleted

## 2014-07-03 ENCOUNTER — Inpatient Hospital Stay (HOSPITAL_COMMUNITY)
Admission: AD | Admit: 2014-07-03 | Discharge: 2014-07-03 | Disposition: A | Discharge status: Home / Self Care | Payer: Medicaid Other | Source: Ambulatory Visit | Attending: Obstetrics & Gynecology | Admitting: Obstetrics & Gynecology

## 2014-07-03 DIAGNOSIS — Z3A09 9 weeks gestation of pregnancy: Secondary | ICD-10-CM | POA: Diagnosis present

## 2014-07-03 DIAGNOSIS — O219 Vomiting of pregnancy, unspecified: Secondary | ICD-10-CM | POA: Diagnosis not present

## 2014-07-03 DIAGNOSIS — O210 Mild hyperemesis gravidarum: Secondary | ICD-10-CM | POA: Diagnosis present

## 2014-07-03 DIAGNOSIS — R110 Nausea: Secondary | ICD-10-CM | POA: Insufficient documentation

## 2014-07-03 LAB — URINALYSIS, ROUTINE W REFLEX MICROSCOPIC
Bilirubin Urine: NEGATIVE
GLUCOSE, UA: NEGATIVE mg/dL
Hgb urine dipstick: NEGATIVE
Ketones, ur: NEGATIVE mg/dL
LEUKOCYTES UA: NEGATIVE
Nitrite: NEGATIVE
PH: 7.5 (ref 5.0–8.0)
PROTEIN: NEGATIVE mg/dL
Specific Gravity, Urine: 1.015 (ref 1.005–1.030)
Urobilinogen, UA: 0.2 mg/dL (ref 0.0–1.0)

## 2014-07-03 MED ORDER — DOXYLAMINE-PYRIDOXINE 10-10 MG PO TBEC: 2.0000 | DELAYED_RELEASE_TABLET | Freq: Every day | ORAL | Status: DC

## 2014-07-03 MED ORDER — PROMETHAZINE HCL 25 MG PO TABS: 12.5000 mg | ORAL_TABLET | Freq: Four times a day (QID) | ORAL | Status: DC | PRN

## 2014-07-03 MED ORDER — SCOPOLAMINE 1 MG/3DAYS TD PT72
1.0000 | MEDICATED_PATCH | Freq: Once | TRANSDERMAL | Status: DC
  Administered 2014-07-03: 1.5 mg via TRANSDERMAL
  Filled 2014-07-03: qty 1

## 2014-07-03 MED ORDER — SCOPOLAMINE 1 MG/3DAYS TD PT72: 1.0000 | MEDICATED_PATCH | Freq: Once | TRANSDERMAL | Status: DC
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
## 2014-07-03 NOTE — Discharge Instructions (Signed)
[facility]    [facility]  [phone]     Phone: [phone]          [facility]                      [facility]   [place]     Phone: [phone]  Phone: [phone]         [facility]     Phone: [phone]  Phone: [phone]           [facility] [place]                hone: [phone]  Phone: [phone]         [facility] [address] Francoise Ceo      Phone: [phone]  Phone: [phone]         [facility]                Phone: [phone]  [facility]   [phone]    [facility] [address] [place])          Phone: [phone] [facility]   Phone: [phone]  Hyperemesis Gravidarum Hyperemesis gravidarum is a severe form of nausea and vomiting that happens during pregnancy. Hyperemesis is worse than morning sickness. It may cause you to have nausea or vomiting all day for many days. It may keep you from eating and drinking enough food and liquids. Hyperemesis usually occurs during the first half (the first 20 weeks) of pregnancy. It often goes away once a woman is in her second half of pregnancy. However, sometimes hyperemesis continues through an entire pregnancy.  CAUSES  The cause of this condition is not completely known but is thought to be related to changes in the body's hormones when pregnant. It could be from the high level of the pregnancy hormone or an increase in estrogen in the body.  SIGNS AND SYMPTOMS   Severe nausea and vomiting.  Nausea that does not go away.  Vomiting that does not allow you to keep any food down.  Weight loss and body fluid loss (dehydration).  Having no desire to eat or not liking food you have previously enjoyed. DIAGNOSIS  Your health care provider will do a physical exam and ask you about  your symptoms. He or she may also order blood tests and urine tests to make sure something else is not causing the problem.  TREATMENT  You may only need medicine to control the problem. If medicines do not control the nausea and vomiting, you will be treated in the hospital to prevent dehydration, increased acid in the blood (acidosis), weight loss, and changes in the electrolytes in your body that may harm the unborn baby (fetus). You may need IV fluids.  HOME CARE INSTRUCTIONS   Only take over-the-counter or prescription medicines as directed by your health care provider.  Try eating a couple of dry crackers or toast in the morning before getting out of bed.  Avoid foods and smells that upset your stomach.  Avoid fatty and spicy foods.  Eat 5-6 small meals a day.  Do not drink when eating meals. Drink between meals.  For snacks, eat high-protein foods, such as cheese.  Eat or suck on things that have ginger in them. Ginger helps nausea.  Avoid food preparation.  The smell of food can spoil your appetite.  Avoid iron pills and iron in your multivitamins until after 3-4 months of being pregnant. However, consult with your health care provider before stopping any prescribed iron pills. SEEK MEDICAL CARE IF:   Your abdominal pain increases.  You have a severe headache.  You have vision problems.  You are losing weight. SEEK IMMEDIATE MEDICAL CARE IF:   You are unable to keep fluids down.  You vomit blood.  You have constant nausea and vomiting.  You have excessive weakness.  You have extreme thirst.  You have dizziness or fainting.  You have a fever or persistent symptoms for more than 2-3 days.  You have a fever and your symptoms suddenly get worse. MAKE SURE YOU:   Understand these instructions.  Will watch your condition.  Will get help right away if you are not doing well or get worse. Document Released: 04/19/2005 Document Revised: 02/07/2013 Document  Reviewed: 11/29/2012 [facility] Patient Information 2015 [place] VillageExitCare, [place]LLC. This information is not intended to replace advice given to you by your health care provider. Make sure you discuss any questions you have with your health care provider.

## 2014-07-03 NOTE — MAU Note (Signed)
Pt reports persistent vomiting. Four episodes of vomiting today.

## 2014-08-08 ENCOUNTER — Other Ambulatory Visit: Payer: Self-pay

## 2014-08-09 LAB — CYTOLOGY - PAP

## 2014-08-12 LAB — OB RESULTS CONSOLE HEPATITIS B SURFACE ANTIGEN: HEP B S AG: NEGATIVE

## 2014-08-12 LAB — OB RESULTS CONSOLE HIV ANTIBODY (ROUTINE TESTING)
HIV: NONREACTIVE
HIV: NONREACTIVE

## 2014-08-12 LAB — OB RESULTS CONSOLE RPR: RPR: NONREACTIVE

## 2014-08-12 LAB — OB RESULTS CONSOLE RUBELLA ANTIBODY, IGM: RUBELLA: IMMUNE

## 2014-08-15 ENCOUNTER — Encounter (HOSPITAL_COMMUNITY): Payer: Self-pay | Admitting: *Deleted

## 2014-08-15 ENCOUNTER — Inpatient Hospital Stay (HOSPITAL_COMMUNITY)
Admission: AD | Admit: 2014-08-15 | Discharge: 2014-08-15 | Disposition: A | Discharge status: Home / Self Care | Payer: Medicaid Other | Source: Ambulatory Visit | Attending: Obstetrics and Gynecology | Admitting: Obstetrics and Gynecology

## 2014-08-15 DIAGNOSIS — O219 Vomiting of pregnancy, unspecified: Secondary | ICD-10-CM

## 2014-08-15 DIAGNOSIS — Z3A16 16 weeks gestation of pregnancy: Secondary | ICD-10-CM | POA: Diagnosis not present

## 2014-08-15 DIAGNOSIS — Z3A15 15 weeks gestation of pregnancy: Secondary | ICD-10-CM | POA: Insufficient documentation

## 2014-08-15 DIAGNOSIS — O2342 Unspecified infection of urinary tract in pregnancy, second trimester: Secondary | ICD-10-CM

## 2014-08-15 DIAGNOSIS — O210 Mild hyperemesis gravidarum: Secondary | ICD-10-CM | POA: Insufficient documentation

## 2014-08-15 LAB — URINALYSIS, ROUTINE W REFLEX MICROSCOPIC
Bilirubin Urine: NEGATIVE
Glucose, UA: NEGATIVE mg/dL
Hgb urine dipstick: NEGATIVE
KETONES UR: 15 mg/dL — AB
LEUKOCYTES UA: NEGATIVE
NITRITE: NEGATIVE
PROTEIN: NEGATIVE mg/dL
Specific Gravity, Urine: 1.02 (ref 1.005–1.030)
UROBILINOGEN UA: 4 mg/dL — AB (ref 0.0–1.0)
pH: 7.5 (ref 5.0–8.0)

## 2014-08-15 LAB — COMPREHENSIVE METABOLIC PANEL
ALT: 18 U/L (ref 0–35)
ANION GAP: 8 (ref 5–15)
AST: 22 U/L (ref 0–37)
Albumin: 3.7 g/dL (ref 3.5–5.2)
Alkaline Phosphatase: 39 U/L (ref 39–117)
BUN: 8 mg/dL (ref 6–23)
CALCIUM: 9.9 mg/dL (ref 8.4–10.5)
CO2: 25 mmol/L (ref 19–32)
Chloride: 103 mmol/L (ref 96–112)
Creatinine, Ser: 0.44 mg/dL — ABNORMAL LOW (ref 0.50–1.10)
GLUCOSE: 84 mg/dL (ref 70–99)
Potassium: 4.1 mmol/L (ref 3.5–5.1)
Sodium: 136 mmol/L (ref 135–145)
Total Bilirubin: 0.5 mg/dL (ref 0.3–1.2)
Total Protein: 7.3 g/dL (ref 6.0–8.3)

## 2014-08-15 LAB — CBC
HCT: 33.7 % — ABNORMAL LOW (ref 36.0–46.0)
HEMOGLOBIN: 11.7 g/dL — AB (ref 12.0–15.0)
MCH: 31.5 pg (ref 26.0–34.0)
MCHC: 34.7 g/dL (ref 30.0–36.0)
MCV: 90.8 fL (ref 78.0–100.0)
Platelets: 263 10*3/uL (ref 150–400)
RBC: 3.71 MIL/uL — AB (ref 3.87–5.11)
RDW: 13.1 % (ref 11.5–15.5)
WBC: 9.4 10*3/uL (ref 4.0–10.5)

## 2014-08-15 MED ORDER — ONDANSETRON HCL 4 MG/2ML IJ SOLN
4.0000 mg | INTRAMUSCULAR | Status: AC
  Administered 2014-08-15: 4 mg via INTRAVENOUS
  Filled 2014-08-15: qty 2

## 2014-08-15 MED ORDER — CEFTRIAXONE SODIUM IN DEXTROSE 40 MG/ML IV SOLN
2.0000 g | Freq: Once | INTRAVENOUS | Status: AC
  Administered 2014-08-15: 2 g via INTRAVENOUS
  Filled 2014-08-15: qty 50

## 2014-08-15 MED ORDER — PROMETHAZINE HCL 25 MG/ML IJ SOLN: 25.0000 mg | Freq: Once | INTRAMUSCULAR | Status: DC

## 2014-08-15 MED ORDER — SODIUM CHLORIDE 0.9 % IV BOLUS (SEPSIS)
1000.0000 mL | Freq: Once | INTRAVENOUS | Status: AC
  Administered 2014-08-15: 1000 mL via INTRAVENOUS

## 2014-08-15 MED ORDER — ONDANSETRON 4 MG PO TBDP: 4.0000 mg | ORAL_TABLET | Freq: Four times a day (QID) | ORAL | Status: DC | PRN

## 2014-08-15 NOTE — Discharge Instructions (Signed)
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
## 2014-08-15 NOTE — MAU Note (Signed)
Pt reports she has been having n/v for several weeks. Has scopolamine patch but it made her ear break out had to stop using it. given zantac without much releif. Was started on Macrobid thsi week for a UTI but have been unable to take it due to the n/v. Called office and was told to come her for fluids and IV antibiotic.

## 2014-11-01 NOTE — Addendum Note (Signed)
Addended by: Corwin LevinsJOHN, Artrice Kraker W on: 11/01/2014 01:43 PM   Modules accepted: Kipp BroodSmartSet

## 2014-11-14 LAB — OB RESULTS CONSOLE RPR: RPR: NONREACTIVE

## 2014-12-27 ENCOUNTER — Other Ambulatory Visit: Payer: Self-pay | Admitting: Obstetrics and Gynecology

## 2014-12-27 LAB — OB RESULTS CONSOLE GBS: GBS: NEGATIVE

## 2015-01-03 ENCOUNTER — Other Ambulatory Visit: Payer: Self-pay

## 2015-01-08 LAB — OB RESULTS CONSOLE GC/CHLAMYDIA
Chlamydia: NEGATIVE
GC PROBE AMP, GENITAL: NEGATIVE

## 2015-01-22 ENCOUNTER — Inpatient Hospital Stay (HOSPITAL_COMMUNITY)
Admission: AD | Admit: 2015-01-22 | Discharge: 2015-01-22 | Disposition: A | Discharge status: Home / Self Care | Payer: Medicaid Other | Source: Ambulatory Visit | Attending: Obstetrics | Admitting: Obstetrics

## 2015-01-22 ENCOUNTER — Encounter (HOSPITAL_COMMUNITY): Payer: Self-pay

## 2015-01-22 DIAGNOSIS — Z3A38 38 weeks gestation of pregnancy: Secondary | ICD-10-CM

## 2015-01-22 DIAGNOSIS — O4292 Full-term premature rupture of membranes, unspecified as to length of time between rupture and onset of labor: Secondary | ICD-10-CM

## 2015-01-22 HISTORY — DX: Chronic kidney disease, unspecified: N18.9

## 2015-01-22 LAB — AMNISURE RUPTURE OF MEMBRANE (ROM) NOT AT ARMC: Amnisure ROM: NEGATIVE
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
## 2015-01-22 NOTE — Discharge Instructions (Signed)
Braxton Hicks Contractions °Contractions of the uterus can occur throughout pregnancy. Contractions are not always a sign that you are in labor.  °WHAT ARE BRAXTON HICKS CONTRACTIONS?  °Contractions that occur before labor are called Braxton Hicks contractions, or false labor. Toward the end of pregnancy (32-34 weeks), these contractions can develop more often and may become more forceful. This is not true labor because these contractions do not result in opening (dilatation) and thinning of the cervix. They are sometimes difficult to tell apart from true labor because these contractions can be forceful and people have different pain tolerances. You should not feel embarrassed if you go to the hospital with false labor. Sometimes, the only way to tell if you are in true labor is for your health care provider to look for changes in the cervix. °If there are no prenatal problems or other health problems associated with the pregnancy, it is completely safe to be sent home with false labor and await the onset of true labor. °HOW CAN YOU TELL THE DIFFERENCE BETWEEN TRUE AND FALSE LABOR? °False Labor °· The contractions of false labor are usually shorter and not as hard as those of true labor.   °· The contractions are usually irregular.   °· The contractions are often felt in the front of the lower abdomen and in the groin.   °· The contractions may go away when you walk around or change positions while lying down.   °· The contractions get weaker and are shorter lasting as time goes on.   °· The contractions do not usually become progressively stronger, regular, and closer together as with true labor.   °True Labor °· Contractions in true labor last 30-70 seconds, become very regular, usually become more intense, and increase in frequency.   °· The contractions do not go away with walking.   °· The discomfort is usually felt in the top of the uterus and spreads to the lower abdomen and low back.   °· True labor can be  determined by your health care provider with an exam. This will show that the cervix is dilating and getting thinner.   °WHAT TO REMEMBER °· Keep up with your usual exercises and follow other instructions given by your health care provider.   °· Take medicines as directed by your health care provider.   °· Keep your regular prenatal appointments.   °· Eat and drink lightly if you think you are going into labor.   °· If Braxton Hicks contractions are making you uncomfortable:   °¨ Change your position from lying down or resting to walking, or from walking to resting.   °¨ Sit and rest in a tub of warm water.   °¨ Drink 2-3 glasses of water. Dehydration may cause these contractions.   °¨ Do slow and deep breathing several times an hour.   °WHEN SHOULD I SEEK IMMEDIATE MEDICAL CARE? °Seek immediate medical care if: °· Your contractions become stronger, more regular, and closer together.   °· You have fluid leaking or gushing from your vagina.   °· You have a fever.   °· You pass blood-tinged mucus.   °· You have vaginal bleeding.   °· You have continuous abdominal pain.   °· You have low back pain that you never had before.   °· You feel your baby's head pushing down and causing pelvic pressure.   °· Your baby is not moving as much as it used to.   °Document Released: 04/19/2005 Document Revised: 04/24/2013 Document Reviewed: 01/29/2013 °ExitCare® Patient Information ©2015 [employer]. This information is not intended to replace advice given to you by your health care   provider. Make sure you discuss any questions you have with your health care provider. ° °

## 2015-01-22 NOTE — MAU Note (Signed)
Pt states she thinks her mucus plug is coming out, called MD office - underwear has been wet since last night - was advised to come to MAU to R/O SROM.  States mucus is brown.  Has random uc's.

## 2015-01-22 NOTE — MAU Note (Signed)
Dr. Kemper Durie giving patient Catherine Hamilton options. May walk and be reexamined or go home and come back if contractions become stronger, membranes rupture, increased bleeding etc. Pt would like to go home.

## 2015-01-23 ENCOUNTER — Inpatient Hospital Stay (HOSPITAL_COMMUNITY)
Admission: AD | Admit: 2015-01-23 | Discharge: 2015-01-25 | DRG: 775 | Disposition: A | Discharge status: Home / Self Care | Payer: Medicaid Other | Source: Ambulatory Visit | Attending: Obstetrics and Gynecology | Admitting: Obstetrics and Gynecology

## 2015-01-23 ENCOUNTER — Inpatient Hospital Stay (HOSPITAL_COMMUNITY): Payer: Medicaid Other | Admitting: Anesthesiology

## 2015-01-23 ENCOUNTER — Encounter (HOSPITAL_COMMUNITY): Payer: Self-pay | Admitting: *Deleted

## 2015-01-23 DIAGNOSIS — IMO0001 Reserved for inherently not codable concepts without codable children: Secondary | ICD-10-CM

## 2015-01-23 DIAGNOSIS — Z8249 Family history of ischemic heart disease and other diseases of the circulatory system: Secondary | ICD-10-CM | POA: Diagnosis not present

## 2015-01-23 DIAGNOSIS — Z3A38 38 weeks gestation of pregnancy: Secondary | ICD-10-CM | POA: Diagnosis not present

## 2015-01-23 LAB — TYPE AND SCREEN
ABO/RH(D): O POS
Antibody Screen: NEGATIVE

## 2015-01-23 LAB — CBC
HEMATOCRIT: 34.7 % — AB (ref 36.0–46.0)
Hemoglobin: 11.7 g/dL — ABNORMAL LOW (ref 12.0–15.0)
MCH: 31.3 pg (ref 26.0–34.0)
MCHC: 33.7 g/dL (ref 30.0–36.0)
MCV: 92.8 fL (ref 78.0–100.0)
Platelets: 212 10*3/uL (ref 150–400)
RBC: 3.74 MIL/uL — ABNORMAL LOW (ref 3.87–5.11)
RDW: 15.1 % (ref 11.5–15.5)
WBC: 9.7 10*3/uL (ref 4.0–10.5)

## 2015-01-23 LAB — ABO/RH: ABO/RH(D): O POS

## 2015-01-23 MED ORDER — CITRIC ACID-SODIUM CITRATE 334-500 MG/5ML PO SOLN: 30.0000 mL | ORAL | Status: DC | PRN

## 2015-01-23 MED ORDER — METHYLERGONOVINE MALEATE 0.2 MG/ML IJ SOLN: 0.2000 mg | INTRAMUSCULAR | Status: DC | PRN

## 2015-01-23 MED ORDER — ONDANSETRON HCL 4 MG PO TABS: 4.0000 mg | ORAL_TABLET | ORAL | Status: DC | PRN

## 2015-01-23 MED ORDER — ONDANSETRON HCL 4 MG/2ML IJ SOLN: 4.0000 mg | INTRAMUSCULAR | Status: DC | PRN

## 2015-01-23 MED ORDER — EPHEDRINE 5 MG/ML INJ
10.0000 mg | INTRAVENOUS | Status: DC | PRN
  Filled 2015-01-23: qty 2

## 2015-01-23 MED ORDER — BUTORPHANOL TARTRATE 1 MG/ML IJ SOLN
1.0000 mg | INTRAMUSCULAR | Status: DC | PRN
  Administered 2015-01-23: 1 mg via INTRAVENOUS
  Filled 2015-01-23: qty 1

## 2015-01-23 MED ORDER — IBUPROFEN 600 MG PO TABS
600.0000 mg | ORAL_TABLET | Freq: Four times a day (QID) | ORAL | Status: DC
  Administered 2015-01-24 – 2015-01-25 (×7): 600 mg via ORAL
  Filled 2015-01-23 (×6): qty 1

## 2015-01-23 MED ORDER — ZOLPIDEM TARTRATE 5 MG PO TABS: 5.0000 mg | ORAL_TABLET | Freq: Every evening | ORAL | Status: DC | PRN

## 2015-01-23 MED ORDER — OXYCODONE-ACETAMINOPHEN 5-325 MG PO TABS: 2.0000 | ORAL_TABLET | ORAL | Status: DC | PRN

## 2015-01-23 MED ORDER — DIPHENHYDRAMINE HCL 50 MG/ML IJ SOLN: 12.5000 mg | INTRAMUSCULAR | Status: DC | PRN

## 2015-01-23 MED ORDER — LANOLIN HYDROUS EX OINT: TOPICAL_OINTMENT | CUTANEOUS | Status: DC | PRN

## 2015-01-23 MED ORDER — LIDOCAINE HCL (PF) 1 % IJ SOLN
30.0000 mL | INTRAMUSCULAR | Status: DC | PRN
  Filled 2015-01-23: qty 30

## 2015-01-23 MED ORDER — PRENATAL MULTIVITAMIN CH
1.0000 | ORAL_TABLET | Freq: Every day | ORAL | Status: DC
  Administered 2015-01-24 – 2015-01-25 (×2): 1 via ORAL
  Filled 2015-01-23 (×2): qty 1

## 2015-01-23 MED ORDER — BENZOCAINE-MENTHOL 20-0.5 % EX AERO: 1.0000 "application " | INHALATION_SPRAY | CUTANEOUS | Status: DC | PRN

## 2015-01-23 MED ORDER — TETANUS-DIPHTH-ACELL PERTUSSIS 5-2.5-18.5 LF-MCG/0.5 IM SUSP: 0.5000 mL | Freq: Once | INTRAMUSCULAR | Status: DC

## 2015-01-23 MED ORDER — METHYLERGONOVINE MALEATE 0.2 MG PO TABS: 0.2000 mg | ORAL_TABLET | ORAL | Status: DC | PRN

## 2015-01-23 MED ORDER — ACETAMINOPHEN 325 MG PO TABS: 650.0000 mg | ORAL_TABLET | ORAL | Status: DC | PRN

## 2015-01-23 MED ORDER — FENTANYL 2.5 MCG/ML BUPIVACAINE 1/10 % EPIDURAL INFUSION (WH - ANES)
14.0000 mL/h | INTRAMUSCULAR | Status: DC | PRN
  Administered 2015-01-23: 14 mL/h via EPIDURAL
  Filled 2015-01-23: qty 125

## 2015-01-23 MED ORDER — OXYTOCIN 40 UNITS IN LACTATED RINGERS INFUSION - SIMPLE MED
62.5000 mL/h | INTRAVENOUS | Status: DC
  Filled 2015-01-23: qty 1000

## 2015-01-23 MED ORDER — WITCH HAZEL-GLYCERIN EX PADS: 1.0000 "application " | MEDICATED_PAD | CUTANEOUS | Status: DC | PRN

## 2015-01-23 MED ORDER — FLEET ENEMA 7-19 GM/118ML RE ENEM: 1.0000 | ENEMA | RECTAL | Status: DC | PRN

## 2015-01-23 MED ORDER — TERBUTALINE SULFATE 1 MG/ML IJ SOLN
0.2500 mg | Freq: Once | INTRAMUSCULAR | Status: DC | PRN
  Filled 2015-01-23: qty 1

## 2015-01-23 MED ORDER — PHENYLEPHRINE 40 MCG/ML (10ML) SYRINGE FOR IV PUSH (FOR BLOOD PRESSURE SUPPORT)
80.0000 ug | PREFILLED_SYRINGE | INTRAVENOUS | Status: DC | PRN
  Filled 2015-01-23: qty 20
  Filled 2015-01-23: qty 2

## 2015-01-23 MED ORDER — LACTATED RINGERS IV SOLN
INTRAVENOUS | Status: DC
  Administered 2015-01-23: 1000 mL via INTRAVENOUS

## 2015-01-23 MED ORDER — DIBUCAINE 1 % RE OINT: 1.0000 "application " | TOPICAL_OINTMENT | RECTAL | Status: DC | PRN

## 2015-01-23 MED ORDER — OXYCODONE-ACETAMINOPHEN 5-325 MG PO TABS: 1.0000 | ORAL_TABLET | ORAL | Status: DC | PRN

## 2015-01-23 MED ORDER — LACTATED RINGERS IV SOLN
500.0000 mL | INTRAVENOUS | Status: DC | PRN
  Administered 2015-01-23: 500 mL via INTRAVENOUS

## 2015-01-23 MED ORDER — ONDANSETRON HCL 4 MG/2ML IJ SOLN
4.0000 mg | Freq: Four times a day (QID) | INTRAMUSCULAR | Status: DC | PRN
  Administered 2015-01-23: 4 mg via INTRAVENOUS
  Filled 2015-01-23: qty 2

## 2015-01-23 MED ORDER — SENNOSIDES-DOCUSATE SODIUM 8.6-50 MG PO TABS
2.0000 | ORAL_TABLET | ORAL | Status: DC
  Administered 2015-01-24 – 2015-01-25 (×2): 2 via ORAL
  Filled 2015-01-23: qty 2

## 2015-01-23 MED ORDER — ACETAMINOPHEN 325 MG PO TABS
650.0000 mg | ORAL_TABLET | ORAL | Status: DC | PRN
  Administered 2015-01-24 – 2015-01-25 (×2): 650 mg via ORAL
  Filled 2015-01-23 (×2): qty 2

## 2015-01-23 MED ORDER — OXYTOCIN BOLUS FROM INFUSION
500.0000 mL | INTRAVENOUS | Status: DC
  Administered 2015-01-23: 500 mL via INTRAVENOUS

## 2015-01-23 MED ORDER — LIDOCAINE HCL (PF) 1 % IJ SOLN
INTRAMUSCULAR | Status: DC | PRN
  Administered 2015-01-23: 7 mL via EPIDURAL
  Administered 2015-01-23: 8 mL via EPIDURAL

## 2015-01-23 MED ORDER — OXYTOCIN 40 UNITS IN LACTATED RINGERS INFUSION - SIMPLE MED: 1.0000 m[IU]/min | INTRAVENOUS | Status: DC

## 2015-01-23 MED ORDER — DIPHENHYDRAMINE HCL 25 MG PO CAPS: 25.0000 mg | ORAL_CAPSULE | Freq: Four times a day (QID) | ORAL | Status: DC | PRN

## 2015-01-23 MED ORDER — SIMETHICONE 80 MG PO CHEW: 80.0000 mg | CHEWABLE_TABLET | ORAL | Status: DC | PRN
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
## 2015-01-23 NOTE — Anesthesia Procedure Notes (Signed)
Epidural Patient location during procedure: OB Start time: 01/23/2015 9:02 PM End time: 01/23/2015 9:06 PM  Staffing Anesthesiologist: Leilani Able  Preanesthetic Checklist Completed: patient identified, surgical consent, pre-op evaluation, timeout performed, IV checked, risks and benefits discussed and monitors and equipment checked  Epidural Patient position: sitting Prep: site prepped and draped and DuraPrep Patient monitoring: continuous pulse ox and blood pressure Approach: midline Location: L3-L4 Injection technique: LOR air  Needle:  Needle type: Tuohy  Needle gauge: 17 G Needle length: 9 cm and 9 Needle insertion depth: 5 cm cm Catheter type: closed end flexible Catheter size: 19 Gauge Catheter at skin depth: 10 cm Test dose: negative and Other  Assessment Sensory level: T9 Events: blood not aspirated, injection not painful, no injection resistance, negative IV test and no paresthesia  Additional Notes Reason for block:procedure for pain

## 2015-01-23 NOTE — MAU Note (Signed)
C/o intermittent ucs since yesterday @ 1000; ?SROM;

## 2015-01-23 NOTE — Anesthesia Preprocedure Evaluation (Signed)
Anesthesia Evaluation  Patient identified by MRN, date of birth, ID band Patient awake    Reviewed: Allergy & Precautions, H&P , NPO status , Patient's Chart, lab work & pertinent test results  Airway Mallampati: I  TM Distance: >3 FB Neck ROM: full    Dental no notable dental hx. (+) Teeth Intact   Pulmonary neg pulmonary ROS,    Pulmonary exam normal        Cardiovascular negative cardio ROS Normal cardiovascular exam     Neuro/Psych Childhood negative psych ROS   GI/Hepatic negative GI ROS, Neg liver ROS,   Endo/Other  negative endocrine ROS  Renal/GU      Musculoskeletal   Abdominal Normal abdominal exam  (+)   Peds  Hematology   Anesthesia Other Findings   Reproductive/Obstetrics negative OB ROS (+) Pregnancy                             Anesthesia Physical Anesthesia Plan  ASA: II  Anesthesia Plan: Epidural   Post-op Pain Management:    Induction:   Airway Management Planned:   Additional Equipment:   Intra-op Plan:   Post-operative Plan:   Informed Consent: I have reviewed the patients History and Physical, chart, labs and discussed the procedure including the risks, benefits and alternatives for the proposed anesthesia with the patient or authorized representative who has indicated his/her understanding and acceptance.     Plan Discussed with:   Anesthesia Plan Comments:         Anesthesia Quick Evaluation

## 2015-01-24 ENCOUNTER — Encounter (HOSPITAL_COMMUNITY): Payer: Self-pay

## 2015-01-24 LAB — RPR: RPR: NONREACTIVE

## 2015-01-24 LAB — CBC
HCT: 31.9 % — ABNORMAL LOW (ref 36.0–46.0)
Hemoglobin: 10.7 g/dL — ABNORMAL LOW (ref 12.0–15.0)
MCH: 31.1 pg (ref 26.0–34.0)
MCHC: 33.5 g/dL (ref 30.0–36.0)
MCV: 92.7 fL (ref 78.0–100.0)
Platelets: 181 10*3/uL (ref 150–400)
RBC: 3.44 MIL/uL — ABNORMAL LOW (ref 3.87–5.11)
RDW: 15 % (ref 11.5–15.5)
WBC: 14.6 10*3/uL — ABNORMAL HIGH (ref 4.0–10.5)

## 2015-01-24 NOTE — Progress Notes (Signed)
Patient is eating, ambulating, voiding.  Pain control is good.  Filed Vitals:   01/23/15 2302 01/23/15 2350 01/24/15 0045 01/24/15 0510  BP: 120/72 121/71 115/80 113/61  Pulse: 80 78 76 73  Temp:  98.7 F (37.1 C) 97.7 F (36.5 C) 97.3 F (36.3 C)  TempSrc:  Oral Oral Oral  Resp:  Height:      Weight:      SpO2:  97%      Fundus firm Perineum without swelling.  Lab Results  Component Value Date   WBC 9.7 01/23/2015   HGB 11.7* 01/23/2015   HCT 34.7* 01/23/2015   MCV 92.8 01/23/2015   PLT 212 01/23/2015    --/--/O POS, O POS (09/22 1518)/RI  A/P Post partum day 1.  Routine care.  Expect d/c routine.    HORVATH,MICHELLE A

## 2015-01-24 NOTE — Lactation Note (Signed)
This note was copied from the chart of Catherine Hamilton. Lactation Consultation Note Initial visit at 24 hours of age.  Mom is feeding baby with deep latch and audible swallows.  LC assisted with rolling out lower lip and mom reports improved comfort.  Baby finished a good feeding and then latched for several minutes on and off, mom then changed a dirty diaper.  [facility] LC resources given and discussed.  Encouraged to feed with early cues on demand.  Early newborn behavior discussed.  Hand expression demonstrated with colostrum visible.  Mom to call for assist as needed.    Patient Name: Catherine Hamilton [id number] Date: 01/24/2015 Reason for consult: Initial assessment   Maternal Data Has patient been taught Hand Expression?: Yes Does the patient have breastfeeding experience prior to this delivery?: Yes  Feeding Feeding Type: Breast Fed Length of feed:  (several additional minutes)  LATCH Score/Interventions Latch: Grasps breast easily, tongue down, lips flanged, rhythmical sucking. Intervention(s): Adjust position;Assist with latch;Breast massage;Breast compression  Audible Swallowing: Spontaneous and intermittent Intervention(s): Skin to skin  Type of Nipple: Everted at rest and after stimulation  Comfort (Breast/Nipple): Soft / non-tender     Hold (Positioning): Assistance needed to correctly position infant at breast and maintain latch. Intervention(s): Breastfeeding basics reviewed;Support Pillows;Position options;Skin to skin  LATCH Score: 9  Lactation Tools Discussed/Used Pump Review: Setup, frequency, and cleaning Initiated by:: JS   Consult Status Consult Status: Follow-up Date: 01/25/15 Follow-up type: In-patient    Jannifer Rodney 01/24/2015, 10:07 PM

## 2015-01-24 NOTE — Anesthesia Postprocedure Evaluation (Signed)
  Anesthesia Post-op Note  Patient: Catherine Hamilton  Procedure(s) Performed: * No procedures listed *  Patient Location: Mother/Baby  Anesthesia Type:Epidural  Level of Consciousness: awake and alert   Airway and Oxygen Therapy: Patient Spontanous Breathing  Post-op Pain: mild  Post-op Assessment: Post-op Vital signs reviewed, Patient's Cardiovascular Status Stable, Respiratory Function Stable, No signs of Nausea or vomiting, Pain level controlled, No headache, Spinal receding and Patient able to bend at knees              Post-op Vital Signs: Reviewed  Last Vitals:  Filed Vitals:   01/24/15 0840  BP: 111/69  Pulse: 64  Temp: 36.7 C  Resp: 14    Complications: No apparent anesthesia complications
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
## 2015-01-24 NOTE — Discharge Summary (Signed)
Obstetric Discharge Summary Reason for Admission: onset of labor Prenatal Procedures: none Intrapartum Procedures: spontaneous vaginal delivery Postpartum Procedures: none Complications-Operative and Postpartum: none HEMOGLOBIN  Date Value Ref Range Status  01/23/2015 11.7* 12.0 - 15.0 g/dL Final   HCT  Date Value Ref Range Status  01/23/2015 34.7* 36.0 - 46.0 % Final    Discharge Diagnoses: Term Pregnancy-delivered  Discharge Information: Date: 01/24/2015 Activity: pelvic rest Diet: routine Medications: Ibuprofen Condition: stable Instructions: refer to practice specific booklet Discharge to: home Follow-up Information    Follow up with Almon Hercules., MD In 4 weeks.   Specialty:  Obstetrics and Gynecology   Contact information:   [address] [place] [address] [phone]       Newborn Data: Live born female  Birth Weight: 7 lb 14.6 oz (3590 g) APGAR: 8, 10  Home with mother.  Catherine Hamilton,Catherine Hamilton 01/24/2015, 5:43 AM

## 2015-01-25 NOTE — Progress Notes (Signed)
Patient is eating, ambulating, voiding.  Pain control is good.  Filed Vitals:   01/24/15 0510 01/24/15 0840 01/24/15 1900 01/25/15 0618  BP: 113/61 111/69 116/76 111/63  Pulse: 73 64 85 57  Temp: 97.3 F (36.3 C) 98 F (36.7 C) 98 F (36.7 C) 97.9 F (36.6 C)  TempSrc: Oral Oral Oral Oral  Resp: Height:      Weight:      SpO2:  98%  99%    Fundus firm Perineum without swelling.  Lab Results  Component Value Date   WBC 14.6* 01/24/2015   HGB 10.7* 01/24/2015   HCT 31.9* 01/24/2015   MCV 92.7 01/24/2015   PLT 181 01/24/2015    --/--/O POS, O POS (09/22 1518)/RI  A/P Post partum day 1.  Routine care.  Expect d/c today.    HORVATH,MICHELLE A

## 2015-01-25 NOTE — Lactation Note (Signed)
This note was copied from the chart of Catherine Bennette Hasty. Lactation Consultation Note Experienced BF mom states BF going well. Denies difficulty or concerns. Reminded of LC out pt. Services if has any concerns.  Reviewed supply and demand, I&O, and engorgement. Patient Name: Catherine Hamilton [id number] Date: 01/25/2015 Reason for consult: Follow-up assessment   Maternal Data    Feeding    LATCH Score/Interventions                      Lactation Tools Discussed/Used     Consult Status Consult Status: Complete Date: 01/25/15    Charyl Dancer 01/25/2015, 11:25 AM

## 2016-06-21 ENCOUNTER — Ambulatory Visit: Payer: Medicaid Other | Admitting: Obstetrics and Gynecology

## 2016-08-11 ENCOUNTER — Other Ambulatory Visit: Payer: Self-pay | Admitting: Obstetrics and Gynecology

## 2016-08-12 LAB — CYTOLOGY - PAP

## 2016-11-12 ENCOUNTER — Ambulatory Visit: Payer: Medicaid Other | Admitting: Physical Therapy

## 2016-11-23 ENCOUNTER — Ambulatory Visit: Payer: Medicaid Other | Attending: Urology | Admitting: Physical Therapy

## 2016-11-23 ENCOUNTER — Encounter: Payer: Self-pay | Admitting: Physical Therapy

## 2016-11-23 DIAGNOSIS — M62838 Other muscle spasm: Secondary | ICD-10-CM | POA: Diagnosis present

## 2016-11-23 DIAGNOSIS — R278 Other lack of coordination: Secondary | ICD-10-CM

## 2016-11-23 DIAGNOSIS — M6281 Muscle weakness (generalized): Secondary | ICD-10-CM
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
## 2016-11-23 NOTE — Patient Instructions (Addendum)
Use educator to do kegels: Start with 1-2 sec hold, 5 sec rest   Make sure you are relaxing pelvic floor:  Do YOU TUBE : Femme fusion fitness: pelvic floor relaxation meditation  Stretches:  Position yourself as shown grabbing onto feet or behind the knees. You should feel a gentle stretch. Breathe in and allow the pelvic floor muscles to relax. Hold 1 min. 2 times per day.  Adductors, Frog Squat    Crouch with elbows inside knees . Gently push knees outward. Hold _30__ seconds.1 Repeat __1_ times per session. Do _2__ sessions per day.  Copyright  VHI. All rights reserved.   BACK: Child's Pose (Sciatica)    Sit in knee-chest position and reach arms forward. Separate knees for comfort. Hold position for _60__ breaths. Repeat _2__ times. Do _2__ times per day.  Copyright  VHI. All rights reserved.   Piriformis Stretch    Lying on back, pull right knee toward opposite shoulder. Hold _30___ seconds. Repeat __2__ times. Do _1___ sessions per day.  http://gt2.exer.us/258   Copyright  VHI. All rights reserved.   Piriformis Stretch, Supine    Lie supine, one ankle crossed onto opposite knee. Holding bottom leg behind knee, gently pull legs toward chest until stretch is felt in buttock of top leg. Hold _30__ seconds. For deeper stretch gently push top knee away from body.  Repeat _2__ times per session. Do _2__ sessions per day.  Copyright  VHI. All rights reserved.    Supine Knee-to-Chest, Unilateral    Lie on back, hands clasped behind one knee. Pull knee in toward chest until a comfortable stretch is felt in lower back and buttocks. Hold 30___ seconds.  Repeat __2_ times per session. Do _1__ sessions per day.  Copyright  VHI. All rights reserved.  Supine With Rotation    Lie on back with one knee drawn toward chest. Slowly bring bent leg across body until stretch is felt in lower back area. Hold _30__ seconds. Repeat to other side. Repeat _2__ times  per session. Do __2_ sessions per day.  Copyright  VHI. All rights reserved.  Butterfly, Supine    Lie on back, feet together. Lower knees toward floor. Hold 60___ seconds. Repeat _2__ times per session. Do _1__ sessions per day.  Copyright  VHI. All rights reserved.     Hold 1-2 minutes doing deep breathing  Core strength    Transverse Abdominus Activation  Contract your lower abdominals as if you were trying to lift one leg from the table.  Initiate the movement but do no lift foot greater than 1 inch from the table.  Repeat opposite side. 10x each side     Websites to look at for core strengthening: Http://www.dianelee.ca/blog/ Gift[employer].se  Toileting Techniques for Bowel Movements (Defecation) Using your belly (abdomen) and pelvic floor muscles to have a bowel movement is usually instinctive.  Sometimes people can have problems with these muscles and have to relearn proper defecation (emptying) techniques.  If you have weakness in your muscles, organs that are falling out, decreased sensation in your pelvis, or ignore your urge to go, you may find yourself straining to have a bowel movement.  You are straining if you are: . holding your breath or taking in a huge gulp of air and holding it  . keeping your lips and jaw tensed and closed tightly . turning red in the face because of excessive pushing or forcing . developing or worsening your  hemorrhoids . getting faint while pushing .  not emptying completely and have to defecate many times a day  If you are straining, you are actually making it harder for yourself to have a bowel movement.  Many people find they are pulling up with the pelvic floor muscles and closing off instead of opening the anus. Due to lack pelvic floor relaxation and coordination the abdominal muscles, one has to work harder to push the feces out.  Many people have never been taught how to defecate efficiently and effectively.   Notice what happens to your body when you are having a bowel movement.  While you are sitting on the toilet pay attention to the following areas: . Jaw and mouth position . Angle of your hips   . Whether your feet touch the ground or not . Arm placement  . Spine position . Waist . Belly tension . Anus (opening of the anal canal)  An Evacuation/Defecation Plan   Here are the 4 basic points:  1. Lean forward enough for your elbows to rest on your knees 2. Support your feet on the floor or use a low stool if your feet don't touch the floor  3. Push out your belly as if you have swallowed a beach ball-you should feel a widening of your waist 4. Open and relax your pelvic floor muscles, rather than tightening around the anus      The following conditions my require modifications to your toileting posture:  . If you have had surgery in the past that limits your back, hip, pelvic, knee or ankle flexibility . Constipation   Your healthcare practitioner may make the following additional suggestions and adjustments:  1) Sit on the toilet  a) Make sure your feet are supported. b) Notice your hip angle and spine position-most people find it effective to lean forward or raise their knees, which can help the muscles around the anus to relax  c) When you lean forward, place your forearms on your thighs for support  2) Relax suggestions a) Breath deeply in through your nose and out slowly through your mouth as if you are smelling the flowers and blowing out the candles. b) To become aware of how to relax your muscles, contracting and releasing muscles can be helpful.  Pull your pelvic floor muscles in tightly by using the image of holding back gas, or closing around the anus (visualize making a circle smaller) and lifting the anus up and in.  Then release the muscles and your anus should drop down and feel open. Repeat 5 times ending with the feeling of relaxation. c) Keep your pelvic floor  muscles relaxed; let your belly bulge out. d) The digestive tract starts at the mouth and ends at the anal opening, so be sure to relax both ends of the tube.  Place your tongue on the roof of your mouth with your teeth separated.  This helps relax your mouth and will help to relax the anus at the same time.  3) Empty (defecation) a) Keep your pelvic floor and sphincter relaxed, then bulge your anal muscles.  Make the anal opening wide.  b) Stick your belly out as if you have swallowed a beach ball. c) Make your belly wall hard using your belly muscles while continuing to breathe. Doing this makes it easier to open your anus. d) Breath out and give a grunt (or try using other sounds such as ahhhh, shhhhh, ohhhh or grrrrrrr).  4) Finish a) As you finish your bowel movement, pull the  pelvic floor muscles up and in.  This will leave your anus in the proper place rather than remaining pushed out and down. If you leave your anus pushed out and down, it will start to feel as though that is normal and give you incorrect signals about needing to have a bowel movement.    [facility] [address] [place], [place] [address]

## 2017-11-14 DIAGNOSIS — M79671 Pain in right foot: Secondary | ICD-10-CM | POA: Insufficient documentation

## 2017-11-14 DIAGNOSIS — Z789 Other specified health status: Secondary | ICD-10-CM | POA: Insufficient documentation

## 2017-11-14 DIAGNOSIS — O99345 Other mental disorders complicating the puerperium: Secondary | ICD-10-CM

## 2017-11-14 DIAGNOSIS — M722 Plantar fascial fibromatosis: Secondary | ICD-10-CM | POA: Insufficient documentation

## 2017-11-14 DIAGNOSIS — F53 Postpartum depression: Secondary | ICD-10-CM

## 2017-11-14 DIAGNOSIS — M79672 Pain in left foot: Secondary | ICD-10-CM

## 2017-11-14 HISTORY — DX: Other mental disorders complicating the puerperium: O99.345

## 2017-11-14 HISTORY — DX: Postpartum depression: F53.0

## 2017-11-30 ENCOUNTER — Encounter: Payer: Self-pay | Admitting: Gastroenterology

## 2017-12-12 ENCOUNTER — Ambulatory Visit: Payer: Medicaid Other | Admitting: Podiatry

## 2017-12-12 ENCOUNTER — Encounter: Payer: Self-pay | Admitting: Podiatry

## 2017-12-12 ENCOUNTER — Other Ambulatory Visit: Payer: Self-pay | Admitting: Podiatry

## 2017-12-12 ENCOUNTER — Ambulatory Visit (INDEPENDENT_AMBULATORY_CARE_PROVIDER_SITE_OTHER): Payer: Medicaid Other

## 2017-12-12 ENCOUNTER — Other Ambulatory Visit: Payer: Self-pay

## 2017-12-12 VITALS — BP 117/77 | HR 74

## 2017-12-12 DIAGNOSIS — M79672 Pain in left foot: Principal | ICD-10-CM

## 2017-12-12 DIAGNOSIS — M7751 Other enthesopathy of right foot: Secondary | ICD-10-CM

## 2017-12-12 DIAGNOSIS — M779 Enthesopathy, unspecified: Secondary | ICD-10-CM

## 2017-12-12 DIAGNOSIS — M7752 Other enthesopathy of left foot: Secondary | ICD-10-CM

## 2017-12-12 DIAGNOSIS — M722 Plantar fascial fibromatosis: Secondary | ICD-10-CM

## 2017-12-12 DIAGNOSIS — M79671 Pain in right foot: Secondary | ICD-10-CM

## 2017-12-12 MED ORDER — DICLOFENAC SODIUM 75 MG PO TBEC: 75.0000 mg | DELAYED_RELEASE_TABLET | Freq: Two times a day (BID) | ORAL | 2 refills | Status: DC

## 2017-12-12 MED ORDER — TRIAMCINOLONE ACETONIDE 10 MG/ML IJ SUSP
10.0000 mg | Freq: Once | INTRAMUSCULAR | Status: AC
  Administered 2017-12-12: 10 mg

## 2017-12-12 NOTE — Patient Instructions (Signed)

## 2017-12-15 NOTE — Progress Notes (Signed)
Subjective:   Patient ID: Catherine Hamilton, female   DOB: 27 y.o.   MRN: [id number]   HPI Patient presents with severe discomfort plantar aspect both heels long-term duration without remembering any cause or why it started.  Does not smoke and likes to be active   Review of Systems  All other systems reviewed and are negative.       Objective:  Physical Exam  Constitutional: She appears well-developed and well-nourished.  Cardiovascular: Intact distal pulses.  Pulmonary/Chest: Effort normal.  Musculoskeletal: Normal range of motion.  Neurological: She is alert.  Skin: Skin is warm.  Nursing note and vitals reviewed.   Neurovascular status found to be intact muscle strength was found to be adequate patient noted to have exquisite discomfort plantar aspect heel region bilateral with inflammation fluid buildup around the medial bands.  Patient has good digital perfusion and is well oriented x3 with moderate depression of the arch     Assessment:  Acute plantar fasciitis bilateral heel     Plan:  H&P x-rays reviewed condition discussed in great deal.  Today I injected the medial fascial band bilateral 3 mg Kenalog 5 mg Xylocaine and applied fascial brace bilateral with instructions on usage.  Begin oral anti-inflammatories and reappoint to recheck 2 weeks or earlier if needed  X-ray indicates her small spur with no indication of stress fracture or arthritis

## 2017-12-19 ENCOUNTER — Other Ambulatory Visit: Payer: Self-pay

## 2017-12-19 MED ORDER — MELOXICAM 15 MG PO TABS: 15.0000 mg | ORAL_TABLET | Freq: Every day | ORAL | 0 refills | Status: DC

## 2017-12-19 NOTE — Progress Notes (Signed)
err

## 2017-12-20 ENCOUNTER — Encounter: Payer: Self-pay | Admitting: Gastroenterology

## 2017-12-20 ENCOUNTER — Other Ambulatory Visit (INDEPENDENT_AMBULATORY_CARE_PROVIDER_SITE_OTHER): Payer: Medicaid Other

## 2017-12-20 ENCOUNTER — Ambulatory Visit (INDEPENDENT_AMBULATORY_CARE_PROVIDER_SITE_OTHER): Payer: Medicaid Other | Admitting: Gastroenterology

## 2017-12-20 VITALS — BP 96/60 | HR 80 | Ht 60.75 in | Wt 131.2 lb

## 2017-12-20 DIAGNOSIS — R140 Abdominal distension (gaseous): Secondary | ICD-10-CM | POA: Diagnosis not present

## 2017-12-20 DIAGNOSIS — R1013 Epigastric pain: Secondary | ICD-10-CM

## 2017-12-20 LAB — CBC
HCT: 37.4 % (ref 36.0–46.0)
Hemoglobin: 12.5 g/dL (ref 12.0–15.0)
MCHC: 33.4 g/dL (ref 30.0–36.0)
MCV: 91.7 fl (ref 78.0–100.0)
Platelets: 260 10*3/uL (ref 150.0–400.0)
RBC: 4.08 Mil/uL (ref 3.87–5.11)
RDW: 13.9 % (ref 11.5–15.5)
WBC: 6.7 10*3/uL (ref 4.0–10.5)

## 2017-12-20 LAB — COMPREHENSIVE METABOLIC PANEL
ALK PHOS: 66 U/L (ref 39–117)
ALT: 23 U/L (ref 0–35)
AST: 20 U/L (ref 0–37)
Albumin: 4.5 g/dL (ref 3.5–5.2)
BILIRUBIN TOTAL: 0.5 mg/dL (ref 0.2–1.2)
BUN: 16 mg/dL (ref 6–23)
CO2: 28 mEq/L (ref 19–32)
CREATININE: 0.79 mg/dL (ref 0.40–1.20)
Calcium: 10.2 mg/dL (ref 8.4–10.5)
Chloride: 102 mEq/L (ref 96–112)
GFR: 91.94 mL/min (ref >60.00)
GLUCOSE: 115 mg/dL — AB (ref 70–99)
Potassium: 4.1 mEq/L (ref 3.5–5.1)
SODIUM: 136 meq/L (ref 135–145)
TOTAL PROTEIN: 8.2 g/dL (ref 6.0–8.3)

## 2017-12-20 LAB — LIPASE: LIPASE: 34 U/L (ref 11.0–59.0)

## 2017-12-20 LAB — H. PYLORI ANTIBODY, IGG: H Pylori IgG: NEGATIVE

## 2017-12-20 NOTE — Patient Instructions (Signed)
Use over the counter Zantac 150 mg or Pepcid 40 mg at bedtime.   You have been scheduled for a CT scan of the abdomen and pelvis at [facility] ([address]---this is in the same building as Press [facility]).   You are scheduled on 12-29-17 at 1:30 pm. You should arrive 15 minutes prior to your appointment time for registration. Please follow the written instructions below on the day of your exam:  WARNING: IF YOU ARE ALLERGIC TO IODINE/X-RAY DYE, PLEASE NOTIFY RADIOLOGY IMMEDIATELY AT [phone]! YOU WILL BE GIVEN A 13 HOUR PREMEDICATION PREP.  1) Do not eat or drink anything after 11:30 am (2 hours prior to your test) 2) You have been given 1 bottles of oral contrast to drink. The solution may taste better if refrigerated, but do NOT add ice or any other liquid to this solution. Shake well before drinking.     Drink 1 bottle of contrast @ 12:30 pm (1 hour prior to your exam)  You may take any medications as prescribed with a small amount of water except for the following: Metformin, Glucophage, Glucovance, Avandamet, Riomet, Fortamet, Actoplus Met, Janumet, Glumetza or Metaglip. The above medications must be held the day of the exam AND 48 hours after the exam.  The purpose of you drinking the oral contrast is to aid in the visualization of your intestinal tract. The contrast solution may cause some diarrhea. Before your exam is started, you will be given a small amount of fluid to drink. Depending on your individual set of symptoms, you may also receive an intravenous injection of x-ray contrast/dye. Plan on being at [facility] for 30 minutes or longer, depending on the type of exam you are having performed.  This test typically takes 30-45 minutes to complete.  If you have any questions regarding your exam or if you need to reschedule, you may call the CT department at [phone] between the hours of 8:00 am and 5:00 pm,  Monday-Friday.  ________________________________________________________________________
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
## 2017-12-20 NOTE — Progress Notes (Signed)
Reviewed and agree with initial management plan.  Cortez Steelman T. Azarian Starace, MD FACG 

## 2017-12-26 ENCOUNTER — Other Ambulatory Visit: Payer: Self-pay | Admitting: Emergency Medicine

## 2017-12-26 DIAGNOSIS — R140 Abdominal distension (gaseous): Secondary | ICD-10-CM

## 2017-12-26 DIAGNOSIS — R1013 Epigastric pain: Secondary | ICD-10-CM

## 2017-12-27 ENCOUNTER — Ambulatory Visit (HOSPITAL_COMMUNITY)
Admission: RE | Admit: 2017-12-27 | Discharge: 2017-12-27 | Disposition: A | Discharge status: Home / Self Care | Payer: Medicaid Other | Source: Ambulatory Visit | Attending: Gastroenterology | Admitting: Gastroenterology

## 2017-12-27 DIAGNOSIS — R140 Abdominal distension (gaseous): Secondary | ICD-10-CM | POA: Insufficient documentation

## 2017-12-27 DIAGNOSIS — R1013 Epigastric pain: Secondary | ICD-10-CM | POA: Diagnosis present

## 2017-12-29 ENCOUNTER — Ambulatory Visit (INDEPENDENT_AMBULATORY_CARE_PROVIDER_SITE_OTHER)
Admission: RE | Admit: 2017-12-29 | Discharge: 2017-12-29 | Disposition: A | Discharge status: Home / Self Care | Payer: Medicaid Other | Source: Ambulatory Visit | Attending: Internal Medicine | Admitting: Internal Medicine

## 2017-12-29 DIAGNOSIS — R140 Abdominal distension (gaseous): Secondary | ICD-10-CM | POA: Diagnosis not present

## 2017-12-29 DIAGNOSIS — R1013 Epigastric pain: Secondary | ICD-10-CM

## 2017-12-29 MED ORDER — IOPAMIDOL (ISOVUE-300) INJECTION 61%
100.0000 mL | Freq: Once | INTRAVENOUS | Status: AC | PRN
  Administered 2017-12-29: 100 mL via INTRAVENOUS

## 2018-01-04 ENCOUNTER — Encounter: Payer: Self-pay | Admitting: Podiatry

## 2018-01-04 ENCOUNTER — Ambulatory Visit (INDEPENDENT_AMBULATORY_CARE_PROVIDER_SITE_OTHER): Payer: Medicaid Other | Admitting: Podiatry

## 2018-01-04 DIAGNOSIS — M722 Plantar fascial fibromatosis: Secondary | ICD-10-CM

## 2018-01-04 MED ORDER — TRIAMCINOLONE ACETONIDE 10 MG/ML IJ SUSP
10.0000 mg | Freq: Once | INTRAMUSCULAR | Status: AC
  Administered 2018-01-04: 10 mg

## 2018-01-04 NOTE — Progress Notes (Signed)
Subjective:   Patient ID: Catherine Hamilton, female   DOB: 27 y.o.   MRN: [id number]   HPI Patient states the right heel feels really good and there is discomfort in the left heel   ROS      Objective:  Physical Exam  Neurovascular status intact with patient's right heel being better but pain in the left heel     Assessment:  Acute plantar fasciitis left still present     Plan:  Injected the left plantar fascia 3 mg Kenalog 5 Milgram Xylocaine instructed on stretching exercises ice therapy and reappoint as needed

## 2018-01-27 ENCOUNTER — Ambulatory Visit: Payer: Medicaid Other | Admitting: Gastroenterology

## 2018-05-10 ENCOUNTER — Encounter: Payer: Self-pay | Admitting: Podiatry

## 2018-05-10 ENCOUNTER — Ambulatory Visit (INDEPENDENT_AMBULATORY_CARE_PROVIDER_SITE_OTHER): Payer: Medicaid Other | Admitting: Podiatry

## 2018-05-10 DIAGNOSIS — M722 Plantar fascial fibromatosis: Secondary | ICD-10-CM | POA: Diagnosis not present

## 2018-05-10 DIAGNOSIS — M79676 Pain in unspecified toe(s): Secondary | ICD-10-CM

## 2018-05-10 MED ORDER — TRIAMCINOLONE ACETONIDE 10 MG/ML IJ SUSP
10.0000 mg | Freq: Once | INTRAMUSCULAR | Status: AC
  Administered 2018-05-10: 10 mg

## 2018-05-10 NOTE — Progress Notes (Signed)
Subjective:   Patient ID: Catherine Hamilton, female   DOB: 28 y.o.   MRN: [id number]   HPI Patient states she did great for about 3-1/2 months and is developed pain in the mid arch area right and more distal arch left and states she is extremely active and tries to workout every day   ROS      Objective:  Physical Exam  Neurovascular status intact muscle strength adequate mid arch inflammation fluid right and distal fascial inflammation left both being painful with moderate depression of the arch     Assessment:  Fasciitis-like symptoms right over left with inflammation mid arch and distal arch left with chronic discomfort associated with this     Plan:  H&P condition reviewed and I recommended orthotics to try to lift up the arch.  Today I did a mid arch injection right 3 mg Kenalog 5 mg Xylocaine distal fascial injection left 3 mg Kenalog 5 mg Xylocaine advised on supportive therapy and patient will be seen back when orthotics are returned.  Patient understands ultimately may require other treatments and discussed shockwave and this would be a very difficult surgical intervention due to where her pain is at

## 2018-06-08 ENCOUNTER — Telehealth: Payer: Self-pay | Admitting: Podiatry
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
## 2018-08-10 ENCOUNTER — Telehealth: Payer: Self-pay | Admitting: Podiatry

## 2018-08-10 NOTE — Telephone Encounter (Signed)
Spoke to pt and she is ok with [place] mailing orthotics to her and aware to call if orthotics need adjustments.

## 2019-05-31 ENCOUNTER — Encounter (HOSPITAL_BASED_OUTPATIENT_CLINIC_OR_DEPARTMENT_OTHER): Payer: Self-pay | Admitting: Emergency Medicine

## 2019-05-31 ENCOUNTER — Encounter (HOSPITAL_BASED_OUTPATIENT_CLINIC_OR_DEPARTMENT_OTHER): Payer: Self-pay

## 2019-05-31 ENCOUNTER — Emergency Department (HOSPITAL_BASED_OUTPATIENT_CLINIC_OR_DEPARTMENT_OTHER)
Admission: EM | Admit: 2019-05-31 | Discharge: 2019-06-01 | Disposition: A | Discharge status: Home / Self Care | Payer: Medicaid Other | Source: Home / Self Care | Attending: Emergency Medicine | Admitting: Emergency Medicine

## 2019-05-31 ENCOUNTER — Emergency Department (HOSPITAL_BASED_OUTPATIENT_CLINIC_OR_DEPARTMENT_OTHER)
Admission: EM | Admit: 2019-05-31 | Discharge: 2019-05-31 | Disposition: A | Discharge status: Home / Self Care | Payer: Medicaid Other | Attending: Emergency Medicine | Admitting: Emergency Medicine

## 2019-05-31 ENCOUNTER — Emergency Department (HOSPITAL_BASED_OUTPATIENT_CLINIC_OR_DEPARTMENT_OTHER): Payer: Medicaid Other

## 2019-05-31 ENCOUNTER — Other Ambulatory Visit: Payer: Self-pay

## 2019-05-31 ENCOUNTER — Encounter: Payer: Self-pay | Admitting: Internal Medicine

## 2019-05-31 DIAGNOSIS — R109 Unspecified abdominal pain: Secondary | ICD-10-CM | POA: Diagnosis not present

## 2019-05-31 DIAGNOSIS — N189 Chronic kidney disease, unspecified: Secondary | ICD-10-CM | POA: Diagnosis not present

## 2019-05-31 DIAGNOSIS — R55 Syncope and collapse: Secondary | ICD-10-CM | POA: Diagnosis present

## 2019-05-31 DIAGNOSIS — K625 Hemorrhage of anus and rectum: Secondary | ICD-10-CM | POA: Diagnosis not present

## 2019-05-31 DIAGNOSIS — Z79899 Other long term (current) drug therapy: Secondary | ICD-10-CM | POA: Diagnosis not present

## 2019-05-31 DIAGNOSIS — K529 Noninfective gastroenteritis and colitis, unspecified: Secondary | ICD-10-CM | POA: Insufficient documentation

## 2019-05-31 DIAGNOSIS — Z793 Long term (current) use of hormonal contraceptives: Secondary | ICD-10-CM | POA: Insufficient documentation

## 2019-05-31 LAB — CBC WITH DIFFERENTIAL/PLATELET
Abs Immature Granulocytes: 0.03 10*3/uL (ref 0.00–0.07)
Basophils Absolute: 0 10*3/uL (ref 0.0–0.1)
Basophils Relative: 0 %
Eosinophils Absolute: 0.1 10*3/uL (ref 0.0–0.5)
Eosinophils Relative: 1 %
HCT: 36.7 % (ref 36.0–46.0)
Hemoglobin: 12.4 g/dL (ref 12.0–15.0)
Immature Granulocytes: 0 %
Lymphocytes Relative: 18 %
Lymphs Abs: 1.6 10*3/uL (ref 0.7–4.0)
MCH: 31.7 pg (ref 26.0–34.0)
MCHC: 33.8 g/dL (ref 30.0–36.0)
MCV: 93.9 fL (ref 80.0–100.0)
Monocytes Absolute: 0.4 10*3/uL (ref 0.1–1.0)
Monocytes Relative: 4 %
Neutro Abs: 6.4 10*3/uL (ref 1.7–7.7)
Neutrophils Relative %: 77 %
Platelets: 275 10*3/uL (ref 150–400)
RBC: 3.91 MIL/uL (ref 3.87–5.11)
RDW: 12.1 % (ref 11.5–15.5)
WBC: 8.5 10*3/uL (ref 4.0–10.5)
nRBC: 0 % (ref 0.0–0.2)

## 2019-05-31 LAB — COMPREHENSIVE METABOLIC PANEL
ALT: 17 U/L (ref 0–44)
AST: 20 U/L (ref 15–41)
Albumin: 4.1 g/dL (ref 3.5–5.0)
Alkaline Phosphatase: 38 U/L (ref 38–126)
Anion gap: 8 (ref 5–15)
BUN: 14 mg/dL (ref 6–20)
CO2: 21 mmol/L — ABNORMAL LOW (ref 22–32)
Calcium: 9.6 mg/dL (ref 8.9–10.3)
Chloride: 107 mmol/L (ref 98–111)
Creatinine, Ser: 0.72 mg/dL (ref 0.44–1.00)
GFR calc Af Amer: >60 mL/min (ref >60)
GFR calc non Af Amer: >60 mL/min (ref >60)
Glucose, Bld: 114 mg/dL — ABNORMAL HIGH (ref 70–99)
Potassium: 3.8 mmol/L (ref 3.5–5.1)
Sodium: 136 mmol/L (ref 135–145)
Total Bilirubin: 0.5 mg/dL (ref 0.3–1.2)
Total Protein: 7.9 g/dL (ref 6.5–8.1)

## 2019-05-31 LAB — PREGNANCY, URINE: Preg Test, Ur: NEGATIVE

## 2019-05-31 LAB — URINALYSIS, ROUTINE W REFLEX MICROSCOPIC
Bilirubin Urine: NEGATIVE
Glucose, UA: NEGATIVE mg/dL
Hgb urine dipstick: NEGATIVE
Ketones, ur: NEGATIVE mg/dL
Leukocytes,Ua: NEGATIVE
Nitrite: NEGATIVE
Protein, ur: NEGATIVE mg/dL
Specific Gravity, Urine: 1.01 (ref 1.005–1.030)
pH: 6 (ref 5.0–8.0)

## 2019-05-31 LAB — LIPASE, BLOOD: Lipase: 24 U/L (ref 11–51)

## 2019-05-31 MED ORDER — IOHEXOL 300 MG/ML  SOLN
100.0000 mL | Freq: Once | INTRAMUSCULAR | Status: AC | PRN
  Administered 2019-05-31: 100 mL via INTRAVENOUS

## 2019-05-31 MED ORDER — SODIUM CHLORIDE 0.9 % IV BOLUS
1000.0000 mL | Freq: Once | INTRAVENOUS | Status: AC
  Administered 2019-05-31: 1000 mL via INTRAVENOUS

## 2019-05-31 MED ORDER — ALUM & MAG HYDROXIDE-SIMETH 200-200-20 MG/5ML PO SUSP
30.0000 mL | Freq: Once | ORAL | Status: AC
  Administered 2019-06-01: 30 mL via ORAL
  Filled 2019-05-31: qty 30

## 2019-05-31 MED ORDER — DICYCLOMINE HCL 10 MG PO CAPS
10.0000 mg | ORAL_CAPSULE | Freq: Once | ORAL | Status: AC
  Administered 2019-06-01: 10 mg via ORAL
  Filled 2019-05-31: qty 1

## 2019-05-31 NOTE — Discharge Instructions (Addendum)
Follow-up with [facility] in the next 3 to 4 days, and return to the ER if you develop worsening bleeding, chest pain, lightheadedness, severe abdominal pain, or other new and concerning symptoms.
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
## 2019-05-31 NOTE — Progress Notes (Signed)
Contacted by her sister and spoke to patient. Abdominal pain with brief syncope today and then small volume hematochezia.  Seen and released from ED.  I have reviewed that note.  Patient had one more small volume dark red stool described as like syrup.  Abdominal pain low grade now.  Advised liquid diet. If worsening bleeding, bad pain, more syncope would need ED again.  We will contact her in AM t o arrange follow up

## 2019-05-31 NOTE — ED Triage Notes (Addendum)
Pt states she passed out at home ~2pm-states she hit right side of face on towel rack-estimates was out x 4 min-also ~30 min PTA she had bloody stool x 1 event-NAD-steady gait-states she drove self to ED

## 2019-05-31 NOTE — ED Triage Notes (Addendum)
Pt arrives with c/o epigastric pain since today - has gotten worse since leaving ED. States no hx of same - chart review shows 10 year hx of epigastric abd pain. Continues to have BRBPR. Seen already today in our ED for same.

## 2019-06-01 ENCOUNTER — Ambulatory Visit (INDEPENDENT_AMBULATORY_CARE_PROVIDER_SITE_OTHER): Payer: Medicaid Other | Admitting: Nurse Practitioner

## 2019-06-01 ENCOUNTER — Telehealth: Payer: Self-pay | Admitting: Nurse Practitioner

## 2019-06-01 ENCOUNTER — Encounter (HOSPITAL_BASED_OUTPATIENT_CLINIC_OR_DEPARTMENT_OTHER): Payer: Self-pay | Admitting: Emergency Medicine

## 2019-06-01 ENCOUNTER — Encounter: Payer: Self-pay | Admitting: Nurse Practitioner

## 2019-06-01 VITALS — BP 102/72 | HR 71 | Temp 98.2°F | Ht 60.0 in | Wt 133.1 lb

## 2019-06-01 DIAGNOSIS — R1033 Periumbilical pain: Secondary | ICD-10-CM | POA: Diagnosis not present

## 2019-06-01 DIAGNOSIS — K921 Melena: Secondary | ICD-10-CM

## 2019-06-01 DIAGNOSIS — Z01818 Encounter for other preprocedural examination: Secondary | ICD-10-CM

## 2019-06-01 LAB — RAPID URINE DRUG SCREEN, HOSP PERFORMED
Amphetamines: NOT DETECTED
Barbiturates: NOT DETECTED
Benzodiazepines: NOT DETECTED
Cocaine: NOT DETECTED
Opiates: NOT DETECTED
Tetrahydrocannabinol: NOT DETECTED

## 2019-06-01 LAB — OCCULT BLOOD X 1 CARD TO LAB, STOOL: Fecal Occult Bld: NEGATIVE

## 2019-06-01 MED ORDER — AMOXICILLIN-POT CLAVULANATE 875-125 MG PO TABS
1.0000 | ORAL_TABLET | Freq: Once | ORAL | Status: AC
  Administered 2019-06-01: 1 via ORAL
  Filled 2019-06-01: qty 1

## 2019-06-01 MED ORDER — DICYCLOMINE HCL 20 MG PO TABS: 20.0000 mg | ORAL_TABLET | Freq: Two times a day (BID) | ORAL | 0 refills | Status: DC

## 2019-06-01 MED ORDER — DICYCLOMINE HCL 10 MG PO CAPS: 10.0000 mg | ORAL_CAPSULE | Freq: Three times a day (TID) | ORAL | 0 refills | Status: DC | PRN

## 2019-06-01 MED ORDER — AMOXICILLIN-POT CLAVULANATE 875-125 MG PO TABS: 1.0000 | ORAL_TABLET | Freq: Two times a day (BID) | ORAL | 0 refills | Status: DC

## 2019-06-01 NOTE — Telephone Encounter (Signed)
Bre, can you reschedule Catherine Hamilton's colonoscopy with Dr. Russella Dar. I initially scheduled her colonoscopy with Dr .Leone Payor as he recently communicated with the patient but that was my error. Her procedure should be with Dr. Russella Dar who was supervising physician when the patient was first seen in the office with Doug Sou.  Thank you!

## 2019-06-01 NOTE — Progress Notes (Signed)
Reviewed and agree with management plan.  Zaharah Amir T. Galit Urich, MD FACG [facility]  

## 2019-06-01 NOTE — Progress Notes (Signed)
06/01/2019 Catherine Hamilton [id number] Aug 27, 1990  Chief Complaint: abdominal pain, rectal bleeding   HISTORY OF PRESENT ILLNESS:  Catherine Hamilton is a 29 year old female with a past medical history of anxiety, IDA, CKD, seizure disorder as a child. She has a 10 year history of central abdominal pain. No surgical history. Yesterday, she felt fine earlier in the day then she developed abrupt abdominal pain localized about 1 cm above her umbilicus. Her abdominal pain got worse and she went to the bathroom to urinate but she passed out on the floor on her way to the bathroom. Her syncopal episode was unwitnessed. She was home with her children. She estimates losing consciousness for about 2 minutes.  She went back and rested and her bed for about 30 minutes.  She went back to the bathroom and passed a moderate amount of bright red blood per the rectum without passing any stool.  She reported the blood was heavy and settled to the bottom of the toilet bowl.  She felt as if she was having contraction type pain which radiated to her lower back.  She presented to [facility] emergency room for further evaluation. Her Hemoglobin level was 12.4.  A rectal exam was done by the ER physician which did not show any evidence of active rectal bleeding, hemorrhoids or fissure. She was discharged home with instructions to follow up in our office and to consider colonoscopy evaluation.  At home, she had at least 3 more episodes of passing bright red blood per the rectum with continued lower abdominal and back cramping type pain.She went back to [facility] ED. An abdominal/pelvic CT showed mild thickening of the distal descending proximal sigmoid colon possibly due to collapsed appearance versus early colitis. Small left adnexal cyst noted.  She was  prescribed Augmentin 875 mg 1 p.o. twice daily for possible colitis and Dicyclomine 20 mg 1 p.o. twice daily as needed.  She presents today for further  evaluation.  Currently, she is fatigued.  She passed a soft mud-like bowel movement at 11 AM today without any further rectal bleeding.  He typically passes a normal formed bowel movement twice weekly which is her normal bowel pattern.  No fever, sweats or chills.  She is eating a bland diet and trying to push fluids.  No family history of gastric or colorectal cancer.  Her paternal cousin has Crohn's disease.  Her father has a history of diverticulosis.  No NSAID use.  Labs 05/31/2019: WBC 8.5.  Hemoglobin 12.4.  Hematocrit 36.7.  Platelet 275. (Baseline hemoglobin 12.5 on 12/20/2017). Glucose 114.  BUN 14.  Creatinine 0.72.  Sodium 136.  Potassium 3.8.  Chloride 107.  CO2 21.  Calcium 9.6.  Total protein 7.9.  Total bili 0.5.  Alk phos 38.  AST 20.  ALT 17.  Urine hCG negative.  Abdominal/Pelvic CT 05/31/2019: 1. Small left adnexal cyst, likely ovarian in origin. 2. Mild thickening of the distal descending and proximal sigmoid colon which is likely due to their collapsed appearance. Early colitis cannot completely be excluded.     reports that she has never smoked. She has never used smokeless tobacco. She reports previous alcohol use. She reports that she does not use drugs. family history includes Hypertension in her maternal grandmother and mother; Other in her mother. Allergies  Allergen Reactions  . Adhesive [Tape] Other (See Comments)    Irritation (burn) of the skin. Both tape and band-aid  . Other Other (  See Comments)    Potatoes cause chest pain.  Marland Kitchen Scopolamine Other (See Comments)    Caused burning and irritation.      Outpatient Encounter Medications as of 06/01/2019  Medication Sig  . amoxicillin-clavulanate (AUGMENTIN) 875-125 MG tablet Take 1 tablet by mouth 2 (two) times daily. One po bid x 7 days  . dicyclomine (BENTYL) 20 MG tablet Take 1 tablet (20 mg total) by mouth 2 (two) times daily.  . norethindrone (HEATHER) 0.35 MG tablet Take 1 tablet by mouth daily.  . sertraline  (ZOLOFT) 25 MG tablet Zoloft 25 mg tablet  Take 1 tablet(s) EVERY DAY by oral route for 7 days then increase to 2 pills daily   No facility-administered encounter medications on file as of 06/01/2019.     REVIEW OF SYSTEMS  : All other systems reviewed and negative except where noted in the History of Present Illness.  PHYSICAL EXAM: BP 102/72   Pulse 71   Temp 98.2 F (36.8 C)   Ht 5' (1.524 m)   Wt 133 lb 2 oz (60.4 kg)   BMI 26.00 kg/m  General: Well developed  29 year old female in no acute distress. Head: Normocephalic and atraumatic. Eyes:  Sclerae non-icteric, conjunctive pink. Ears: Normal auditory acuity. Neck: Supple, no lymphadenopathy or thyromegaly.  Mouth: No ulcers or lesions.  Lungs: Clear bilaterally to auscultation without wheezes, crackles or rhonchi. Heart: Regular rate and rhythm. No murmur, rub or gallop appreciated.  Abdomen: Soft, nontender, non distended. No masses. No hepatosplenomegaly. Normoactive bowel sounds x 4 quadrants.  Rectal: Rectal exam was done in the ED 1/28, patient declined further rectal exam at this time.  She elects to proceed with a colonoscopy for further evaluation. Musculoskeletal: Symmetrical with no gross deformities. Skin: Warm and dry. No rash or lesions on visible extremities. Extremities: No edema. Neurological: Alert oriented x 4, no focal deficits.  Psychological:  Alert and cooperative. Normal mood and affect.  ASSESSMENT AND PLAN:  63.  29 year old female with acute on chronic central abdominal pain localized 1 cm above the umbilicus with the development of hematochezia.  CTAP on 1/28 identified mild thickening of the distal descending and proximal sigmoid colon which possibly could present developing colitis -She will continue the Augmentin 875 mg p.o. twice daily for now, not sure that she actually needs to take this, if she develops further loose stools I told her to stop it. Hardin Negus bacteria probiotic once  daily -Push fluids, bland diet -Schedule colonoscopy in 3 to 4 weeks.  Colonoscopy benefits and risk discussed include risk of sedation, bleeding and perforation.      Patient is well aware there can be no chance of pregnancy at the time of colonoscopy.  If her menstrual cycle is late to her colonoscopy she will contact her office and a pregnancy test will be ordered. -Dicyclomine 10 mg p.o. twice daily as needed -Patient to call our office if her symptoms worsen -Further evaluation to be determined after colonoscopy completed -Patient to call our office on Monday 06/04/2019 with an update -Consider small bowel MRI due to chronic periumbilical pain      CC:  No ref. provider found
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
## 2019-06-01 NOTE — Patient Instructions (Addendum)
If you are age 30 or older, your body mass index should be between 23-30. Your Body mass index is 26 kg/m. If this is out of the aforementioned range listed, please consider follow up with your Primary Care Provider.  If you are age 32 or younger, your body mass index should be between 19-25. Your Body mass index is 26 kg/m. If this is out of the aformentioned range listed, please consider follow up with your Primary Care Provider.   You have been scheduled for a colonoscopy. Please follow written instructions given to you at your visit today.  Please pick up your prep supplies at the pharmacy within the next 1-3 days. If you use inhalers (even only as needed), please bring them with you on the day of your procedure. Your physician has requested that you go to www.startemmi.com and enter the access code given to you at your visit today. This web site gives a general overview about your procedure. However, you should still follow specific instructions given to you by our office regarding your preparation for the procedure.  If you develop diarrhea while taking the Augmentin you may stop it.   We have sent the following medications to your pharmacy for you to pick up at your convenience: Dicyclomine   Please purchase the following medications over the counter and take as directed: Vear Clock Bacteria Probiotic once daily.   Call [facility] on Monday with an update.   Thank you,  Alcide Evener

## 2019-06-04 HISTORY — PX: COLONOSCOPY: SHX174

## 2019-06-04 NOTE — Telephone Encounter (Signed)
Dr. Leone Payor accepts patient per verbal order;

## 2019-06-04 NOTE — Telephone Encounter (Signed)
Bre, please check with Dr. Leone Payor if he is ok to do her procedure then that is fine. thx

## 2019-06-04 NOTE — Telephone Encounter (Signed)
Called and spoke with patient-patient given information regarding needing to be scheduled with Dr. Fuller Plan instead of Dr. Chipper Oman reports she spoke with Dr.Gessner and feels more comfortable with him doing the procedure and also because she has never met nor spoken with Dr. Fuller Plan; patient has adamantly requested to stay on Dr. Celesta Aver schedule- please advise

## 2019-06-08 ENCOUNTER — Ambulatory Visit (INDEPENDENT_AMBULATORY_CARE_PROVIDER_SITE_OTHER): Payer: Medicaid Other

## 2019-06-08 ENCOUNTER — Other Ambulatory Visit: Payer: Self-pay | Admitting: Internal Medicine

## 2019-06-08 ENCOUNTER — Other Ambulatory Visit: Payer: Self-pay

## 2019-06-08 DIAGNOSIS — Z1159 Encounter for screening for other viral diseases: Secondary | ICD-10-CM

## 2019-06-09 LAB — SARS CORONAVIRUS 2 (TAT 6-24 HRS): SARS Coronavirus 2: NEGATIVE

## 2019-06-13 ENCOUNTER — Encounter: Payer: Self-pay | Admitting: Internal Medicine

## 2019-06-13 ENCOUNTER — Other Ambulatory Visit: Payer: Self-pay

## 2019-06-13 ENCOUNTER — Ambulatory Visit (AMBULATORY_SURGERY_CENTER): Payer: Medicaid Other | Admitting: Internal Medicine

## 2019-06-13 VITALS — BP 107/62 | HR 74 | Temp 97.8°F | Resp 13 | Ht 60.0 in | Wt 133.0 lb

## 2019-06-13 DIAGNOSIS — K921 Melena: Secondary | ICD-10-CM

## 2019-06-13 MED ORDER — SODIUM CHLORIDE 0.9 % IV SOLN: 500.0000 mL | Freq: Once | INTRAVENOUS | Status: DC

## 2019-06-13 NOTE — Patient Instructions (Addendum)
The colonoscopy is ok - all normal.  Please call and make an appointment to see me in March (next available) so we I can reassess and see how you are, and make additional recommendations for diagnosis or treatment, if needed.  You can also try and take two dicyclomine tablets at a time.  I appreciate the opportunity to care for you. Iva Boop, MD, FACG  YOU HAD AN ENDOSCOPIC PROCEDURE TODAY AT THE [facility]:   Refer to the procedure report that was given to you for any specific questions about what was found during the examination.  If the procedure report does not answer your questions, please call your gastroenterologist to clarify.  If you requested that your care partner not be given the details of your procedure findings, then the procedure report has been included in a sealed envelope for you to review at your convenience later.  YOU SHOULD EXPECT: Some feelings of bloating in the abdomen. Passage of more gas than usual.  Walking can help get rid of the air that was put into your GI tract during the procedure and reduce the bloating. If you had a lower endoscopy (such as a colonoscopy or flexible sigmoidoscopy) you may notice spotting of blood in your stool or on the toilet paper. If you underwent a bowel prep for your procedure, you may not have a normal bowel movement for a few days.  Please Note:  You might notice some irritation and congestion in your nose or some drainage.  This is from the oxygen used during your procedure.  There is no need for concern and it should clear up in a day or so.  SYMPTOMS TO REPORT IMMEDIATELY:   Following lower endoscopy (colonoscopy or flexible sigmoidoscopy):  Excessive amounts of blood in the stool  Significant tenderness or worsening of abdominal pains  Swelling of the abdomen that is new, acute  Fever of 100F or higher   For urgent or emergent issues, a gastroenterologist can be reached at any hour by calling [phone]  [phone].   DIET:  We do recommend a small meal at first, but then you may proceed to your regular diet.  Drink plenty of fluids but you should avoid alcoholic beverages for 24 hours.  ACTIVITY:  You should plan to take it easy for the rest of today and you should NOT DRIVE or use heavy machinery until tomorrow (because of the sedation medicines used during the test).    FOLLOW UP: Our staff will call the number listed on your records 48-72 hours following your procedure to check on you and address any questions or concerns that you may have regarding the information given to you following your procedure. If we do not reach you, we will leave a message.  We will attempt to reach you two times.  During this call, we will ask if you have developed any symptoms of COVID 19. If you develop any symptoms (ie: fever, flu-like symptoms, shortness of breath, cough etc.) before then, please call [phone].  If you test positive for Covid 19 in the 2 weeks post procedure, please call and report this information to [place].    If any biopsies were taken you will be contacted by phone or by letter within the next 1-3 weeks.  Please call us at [phone] if you have not heard about the biopsies in 3 weeks.    SIGNATURES/CONFIDENTIALITY: You and/or your care partner have signed paperwork which will be entered into your  electronic medical record.  These signatures attest to the fact that that the information above on your After Visit Summary has been reviewed and is understood.  Full responsibility of the confidentiality of this discharge information lies with you and/or your care-partner.

## 2019-06-13 NOTE — Progress Notes (Signed)
Report to PACU, RN, vss, BBS= Clear.  

## 2019-06-13 NOTE — Op Note (Signed)
[facility] Patient Name: Catherine Hamilton Procedure Date: 06/13/2019 8:53 AM MRN: [id number] Endoscopist: Iva Boop , MD Age: 29 Referring MD:  Date of Birth: [birth year] Gender: Female Account #: [id number] Procedure:                Colonoscopy Indications:              Hematochezia Medicines:                Propofol per Anesthesia, Monitored Anesthesia Care Procedure:                Pre-Anesthesia Assessment:                           - Prior to the procedure, a History and Physical                            was performed, and patient medications and                            allergies were reviewed. [name] tolerance of                            previous anesthesia was also reviewed. The risks                            and benefits of the procedure and the sedation                            options and risks were discussed with the patient.                            All questions were answered, and informed consent                            was obtained. Prior Anticoagulants: The patient has                            taken no previous anticoagulant or antiplatelet                            agents. ASA Grade Assessment: II - A patient with                            mild systemic disease. After reviewing the risks                            and benefits, the patient was deemed in                            satisfactory condition to undergo the procedure.                           After obtaining informed consent, the colonoscope  was passed under direct vision. Throughout the                            procedure, the patient's blood pressure, pulse, and                            oxygen saturations were monitored continuously. The                            Colonoscope was introduced through the anus and                            advanced to the the terminal ileum, with                            identification of the appendiceal  orifice and IC                            valve. The quality of the bowel preparation was                            excellent. The colonoscopy was performed without                            difficulty. The patient tolerated the procedure                            well. The bowel preparation used was Miralax via                            split dose instruction. Scope In: 9:20:30 AM Scope Out: 9:31:32 AM Scope Withdrawal Time: 0 hours 7 minutes 38 seconds  Total Procedure Duration: 0 hours 11 minutes 2 seconds  Findings:                 The perianal and digital rectal examinations were                            normal.                           The colon (entire examined portion) appeared normal.                           No additional abnormalities were found on                            retroflexion. Complications:            No immediate complications. Estimated blood loss:                            None. Estimated Blood Loss:     Estimated blood loss: none. Impression:               - The entire examined colon is normal. I think she  may have had an episode of ischemic colitis.                           - No specimens collected. Recommendation:           - Patient has a contact number available for                            emergencies. The signs and symptoms of potential                            delayed complications were discussed with the                            patient. Return to normal activities tomorrow.                            Written discharge instructions were provided to the                            patient.                           - Resume previous diet.                           - Continue present medications.                           - No recommendation at this time regarding repeat                            colonoscopy due to young age.                           - Call my office and schedule a follow-up visit Iva Boop, MD 06/13/2019 9:43:25 AM This report has been signed electronically.

## 2019-06-15 ENCOUNTER — Telehealth: Payer: Self-pay

## 2019-06-15 ENCOUNTER — Telehealth: Payer: Self-pay | Admitting: *Deleted

## 2019-06-15 NOTE — Telephone Encounter (Signed)
Left message on follow up call. 

## 2019-06-15 NOTE — Telephone Encounter (Signed)
  Follow up Call-  Call back number 06/13/2019  Post procedure Call Back phone  # [phone]  Permission to leave phone message Yes  Some recent data might be hidden     Patient questions:  Do you have a fever, pain , or abdominal swelling? No. Pain Score  0 *  Have you tolerated food without any problems? Yes.    Have you been able to return to your normal activities? Yes.    Do you have any questions about your discharge instructions: Diet   No. Medications  No. Follow up visit  No.  Do you have questions or concerns about your Care? No.  Actions: * If pain score is 4 or above: No action needed, pain <4.  1. Have you developed a fever since your procedure? no  2.   Have you had an respiratory symptoms (SOB or cough) since your procedure? no  3.   Have you tested positive for COVID 19 since your procedure no  4.   Have you had any family members/close contacts diagnosed with the COVID 19 since your procedure?  no   If yes to any of these questions please route to Laverna Peace, RN and Jennye Boroughs, Charity fundraiser.

## 2019-06-26 ENCOUNTER — Encounter: Payer: Medicaid Other | Admitting: Internal Medicine

## 2019-07-19 ENCOUNTER — Other Ambulatory Visit: Payer: Self-pay | Admitting: Nurse Practitioner

## 2020-03-25 ENCOUNTER — Telehealth: Payer: Self-pay | Admitting: Internal Medicine

## 2020-03-25 NOTE — Telephone Encounter (Signed)
Please schedule follow up with Dr. Leone Payor or APP

## 2020-03-25 NOTE — Telephone Encounter (Signed)
Agree needs f/u  You can use an open banding slot ? 12/6 to see me if she can do that or an APP

## 2020-03-31 NOTE — Telephone Encounter (Signed)
Appointment scheduled w/Sister Toniann Fail for 04-23-20, thank you.

## 2020-04-04 ENCOUNTER — Encounter: Payer: Self-pay | Admitting: Internal Medicine

## 2020-04-04 ENCOUNTER — Ambulatory Visit: Payer: Medicaid Other | Admitting: Internal Medicine

## 2020-04-04 VITALS — BP 102/60 | HR 76 | Ht 61.0 in | Wt 134.0 lb

## 2020-04-04 DIAGNOSIS — R1013 Epigastric pain: Secondary | ICD-10-CM | POA: Diagnosis not present

## 2020-04-04 DIAGNOSIS — R12 Heartburn: Secondary | ICD-10-CM

## 2020-04-04 NOTE — Patient Instructions (Addendum)
Normal BMI (Body Mass Index- based on height and weight) is between 19 and 25. Your BMI today is Body mass index is 25.32 kg/m. Marland Kitchen Please consider follow up  regarding your BMI with your Primary Care Provider.   You have been scheduled for an endoscopy. Please follow written instructions given to you at your visit today. If you use inhalers (even only as needed), please bring them with you on the day of your procedure.  Due to recent changes in healthcare laws, you may see the results of your imaging and laboratory studies on MyChart before your provider has had a chance to review them.  We understand that in some cases there may be results that are confusing or concerning to you. Not all laboratory results come back in the same time frame and the provider may be waiting for multiple results in order to interpret others.  Please give [place] 48 hours in order for your provider to thoroughly review all the results before contacting the office for clarification of your results.   Avoid taking your Prilosec as discussed. If needed you can use over the counter Mylanta, Maalox, or Gaviscon. If the heartburn is too bad take your over the counter Prilosec.  I appreciate the opportunity to care for you. Stan Head, MD, [facility]

## 2020-04-04 NOTE — Progress Notes (Signed)
Catherine Hamilton 29 y.o. 08/04/1990 [id number]  Assessment & Plan:   Encounter Diagnoses  Name Primary?  . Epigastric pain Yes  . Heartburn     I have a suspicion of functional dyspepsia.  Certainly could have GERD.  Ulcer disease possible.  The chronicity and episodic nature with that suggest a functional disturbance but she is concerned about this and I think it is reasonable deformity EGD.  Of asked her to try to stay off the Prilosec prior to that, it will be about 2 weeks before we do the procedure and I think it would be useful to survey the mucosa off acid suppression.  If she needs to take the Prilosec that is okay but hopefully she can make it with antacids instead for the time being.  Further plans pending the endoscopy.     Subjective:   Chief Complaint: Epigastric pain  HPI Catherine Hamilton is a 29 year old Hispanic woman with chronic intermittent epigastric pain that is worse.  She has a history of what I think is probably IBS colonoscopy unrevealing in February for hematochezia, I thought she might have had an episode of ischemic colitis that is healed by the time colonoscopy was performed.  She has talked to me about epigastric pain that comes and goes it had gotten better we had considered an EGD if it persisted it has returned with a steady dull like pressure in the epigastrium.  She also has what she says is "reflux" which sounds like heartburn type symptoms to me intermittently.  Bowel movements are normal without difficulty.  She has tried Zantac in the past for the epigastric pain without benefit.  It will disturb her sleep.  She started Prilosec 20 mg daily recently and it has helped some.  She does not have dysphagia unintentional weight loss nausea or vomiting.  She has been diagnosed with interstitial cystitis. Allergies  Allergen Reactions  . Adhesive [Tape] Other (See Comments)    Irritation (burn) of the skin. Both tape and band-aid.  Paper tape okay.  . Other Other (See  Comments)    Potatoes cause chest pain.  Marland Kitchen Scopolamine Other (See Comments)    Caused burning and irritation.   Current Meds  Medication Sig  . b complex vitamins capsule Take by mouth.  . Omeprazole Magnesium (PRILOSEC OTC PO) Take by mouth.   Past Medical History:  Diagnosis Date  . Allergy   . ANEMIA, IRON DEFICIENCY 09/06/2006   Qualifier: Diagnosis of  By: Senaida Ores CMA,, JEANNETTE    . Anxiety   . Hx of seizure disorder    AS A CHILD  . Interstitial cystitis   . Post partum depression 11/14/2017   Past Surgical History:  Procedure Laterality Date  . 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   Review of Systems As per HPI  Objective:   Physical Exam BP 102/60   Pulse 76   Ht 5\' 1"  (1.549 m)   Wt 134 lb (60.8 kg)  BMI 25.32 kg/m  Lungs cta Cor NL Abdomen soft and nontender no HSM/mass  Neg Carnett's

## 2020-04-07 ENCOUNTER — Ambulatory Visit: Payer: Medicaid Other | Admitting: Internal Medicine

## 2020-04-15 ENCOUNTER — Ambulatory Visit: Payer: Medicaid Other | Admitting: Internal Medicine

## 2020-04-17 ENCOUNTER — Encounter: Payer: Self-pay | Admitting: Certified Registered Nurse Anesthetist

## 2020-04-18 ENCOUNTER — Other Ambulatory Visit: Payer: Self-pay

## 2020-04-18 ENCOUNTER — Encounter: Payer: Self-pay | Admitting: Internal Medicine

## 2020-04-18 ENCOUNTER — Ambulatory Visit (AMBULATORY_SURGERY_CENTER): Payer: Medicaid Other | Admitting: Internal Medicine

## 2020-04-18 VITALS — BP 115/72 | HR 83 | Temp 97.5°F | Resp 17 | Ht 61.0 in | Wt 134.0 lb

## 2020-04-18 DIAGNOSIS — K2970 Gastritis, unspecified, without bleeding: Secondary | ICD-10-CM

## 2020-04-18 DIAGNOSIS — K449 Diaphragmatic hernia without obstruction or gangrene: Secondary | ICD-10-CM | POA: Diagnosis not present

## 2020-04-18 DIAGNOSIS — R1013 Epigastric pain: Secondary | ICD-10-CM

## 2020-04-18 DIAGNOSIS — K200 Eosinophilic esophagitis: Secondary | ICD-10-CM | POA: Diagnosis not present

## 2020-04-18 DIAGNOSIS — K2289 Other specified disease of esophagus: Secondary | ICD-10-CM

## 2020-04-18 DIAGNOSIS — K2950 Unspecified chronic gastritis without bleeding: Secondary | ICD-10-CM

## 2020-04-18 MED ORDER — SODIUM CHLORIDE 0.9 % IV SOLN: 500.0000 mL | Freq: Once | INTRAVENOUS | Status: DC

## 2020-04-18 NOTE — Progress Notes (Signed)
Vitals by SP 

## 2020-04-18 NOTE — Progress Notes (Signed)
A/ox3, pleased with MAC, report to RN 

## 2020-04-18 NOTE — Op Note (Signed)
[facility] Patient Name: Catherine Hamilton Procedure Date: 04/18/2020 7:35 AM MRN: [id number] Endoscopist: Iva Boop , MD Age: 29 Referring MD:  Date of Birth: Nov 09, 1990 Gender: Female Account #: [id number] Procedure:                Upper GI endoscopy Indications:              Epigastric abdominal pain, Dyspepsia Medicines:                Propofol per Anesthesia, Monitored Anesthesia Care Procedure:                Pre-Anesthesia Assessment:                           - Prior to the procedure, a History and Physical                            was performed, and patient medications and                            allergies were reviewed. [name] tolerance of                            previous anesthesia was also reviewed. The risks                            and benefits of the procedure and the sedation                            options and risks were discussed with the patient.                            All questions were answered, and informed consent                            was obtained. Prior Anticoagulants: The patient has                            taken no previous anticoagulant or antiplatelet                            agents. ASA Grade Assessment: I - A normal, healthy                            patient. After reviewing the risks and benefits,                            the patient was deemed in satisfactory condition to                            undergo the procedure.                           After obtaining informed consent, the endoscope was  passed under direct vision. Throughout the                            procedure, the patient's blood pressure, pulse, and                            oxygen saturations were monitored continuously. The                            Endoscope was introduced through the mouth, and                            advanced to the second part of duodenum. The upper                            GI  endoscopy was accomplished without difficulty.                            The patient tolerated the procedure well. Scope In: Scope Out: Findings:                 Mucosal changes including feline appearance,                            longitudinal furrows, crepe paper esophagus and                            white specks were found in the entire esophagus.                            Biopsies were obtained from the proximal and distal                            esophagus with cold forceps for histology of                            suspected eosinophilic esophagitis. Verification of                            patient identification for the specimen was done.                            Estimated blood loss was minimal.                           A 5 cm sliding hiatal hernia was found.                           Patchy mildly erythematous mucosa without bleeding                            was found in the entire examined stomach. Biopsies                            were  taken with a cold forceps for histology.                            Verification of patient identification for the                            specimen was done. Estimated blood loss was minimal.                           The examined duodenum was normal. Biopsies were                            taken with a cold forceps for histology.                            Verification of patient identification for the                            specimen was done. Estimated blood loss was minimal.                           The cardia and gastric fundus were normal on                            retroflexion. Complications:            No immediate complications. Estimated Blood Loss:     Estimated blood loss was minimal. Impression:               - Esophageal mucosal changes consistent with                            eosinophilic esophagitis. Biopsied.                           - 5 cm sliding hiatal hernia.                           -  Erythematous mucosa in the stomach. Biopsied.                           - Normal examined duodenum. Biopsied. Recommendation:           - Patient has a contact number available for                            emergencies. The signs and symptoms of potential                            delayed complications were discussed with the                            patient. Return to normal activities tomorrow.                            Written discharge instructions were provided to the  patient.                           - Resume previous diet.                           - Continue present medications. restart omeprazole                           - Await pathology results. Iva Booparl E [name], MD 04/18/2020 8:14:08 AM This report has been signed electronically.

## 2020-04-18 NOTE — Progress Notes (Signed)
Called to room to assist during endoscopic procedure.  Patient ID and intended procedure confirmed with present staff. Received instructions for my participation in the procedure from the performing physician.  

## 2020-04-18 NOTE — Patient Instructions (Addendum)
You have a 5 cm sliding hiatal hernia.  This is a moderate sized hiatal hernia which is probably contributing to at least some of your symptoms.  The esophagus showed some changes in the lining that do raise a question of something called eosinophilic esophagitis.  In eosinophilic esophagitis ([id number]-o-FILL-ik uh-sof-uh-JIE-tis), a type of white blood cell (eosinophil) builds up in the lining of the tube that connects your mouth to your stomach (esophagus). This buildup, which is a reaction to foods, allergens or acid reflux, can inflame or injure the esophageal tissue. Damaged esophageal tissue can lead to difficulty swallowing or cause food to get caught when you swallow.  Eosinophilic esophagitis is a chronic immune system disease. It has been identified only in the past two decades, but is now considered a major cause of digestive system (gastrointestinal) illness. Research is ongoing and will likely lead to revisions in its diagnosis and treatment.  Your symptoms do not really fit with eosinophilic esophagitis but I took biopsies of the esophagus stomach and upper intestine looking for changes in the lining that could help me understand your problems better and once I get those results back I will let you know.  It is fine to go back on the omeprazole as it may help you.   I appreciate the opportunity to care for you.  Iva Boop, MD  Hiatal hernia handout given to patient.  Resume previous diet. Continue present medications. Restart Omeprazole Await pathology results.  YOU HAD AN ENDOSCOPIC PROCEDURE TODAY AT THE [facility]:   Refer to the procedure report that was given to you for any specific questions about what was found during the examination.  If the procedure report does not answer your questions, please call your gastroenterologist to clarify.  If you requested that your care partner not be given the details of your procedure findings, then the procedure report  has been included in a sealed envelope for you to review at your convenience later.  YOU SHOULD EXPECT: Some feelings of bloating in the abdomen. Passage of more gas than usual.  Walking can help get rid of the air that was put into your GI tract during the procedure and reduce the bloating. If you had a lower endoscopy (such as a colonoscopy or flexible sigmoidoscopy) you may notice spotting of blood in your stool or on the toilet paper. If you underwent a bowel prep for your procedure, you may not have a normal bowel movement for a few days.  Please Note:  You might notice some irritation and congestion in your nose or some drainage.  This is from the oxygen used during your procedure.  There is no need for concern and it should clear up in a day or so.  SYMPTOMS TO REPORT IMMEDIATELY:  Following upper endoscopy (EGD)  Vomiting of blood or coffee ground material  New chest pain or pain under the shoulder blades  Painful or persistently difficult swallowing  New shortness of breath  Fever of 100F or higher  Black, tarry-looking stools  For urgent or emergent issues, a gastroenterologist can be reached at any hour by calling [phone]. Do not use MyChart messaging for urgent concerns.    DIET:  We do recommend a small meal at first, but then you may proceed to your regular diet.  Drink plenty of fluids but you should avoid alcoholic beverages for 24 hours.  ACTIVITY:  You should plan to take it easy for the rest of today and you  should NOT DRIVE or use heavy machinery until tomorrow (because of the sedation medicines used during the test).    FOLLOW UP: Our staff will call the number listed on your records 48-72 hours following your procedure to check on you and address any questions or concerns that you may have regarding the information given to you following your procedure. If we do not reach you, we will leave a message.  We will attempt to reach you two times.  During this call,  we will ask if you have developed any symptoms of COVID 19. If you develop any symptoms (ie: fever, flu-like symptoms, shortness of breath, cough etc.) before then, please call [phone].  If you test positive for Covid 19 in the 2 weeks post procedure, please call and report this information to [place].    If any biopsies were taken you will be contacted by phone or by letter within the next 1-3 weeks.  Please call us at [phone] if you have not heard about the biopsies in 3 weeks.    SIGNATURES/CONFIDENTIALITY: You and/or your care partner have signed paperwork which will be entered into your electronic medical record.  These signatures attest to the fact that that the information above on your After Visit Summary has been reviewed and is understood.  Full responsibility of the confidentiality of this discharge information lies with you and/or your care-partner.

## 2020-04-22 ENCOUNTER — Telehealth: Payer: Self-pay

## 2020-04-22 NOTE — Telephone Encounter (Signed)
  Follow up Call-  Call back number 04/18/2020 06/13/2019  Post procedure Call Back phone  # [phone]  Permission to leave phone message Yes Yes  Some recent data might be hidden     Patient questions:  Do you have a fever, pain , or abdominal swelling? No. Pain Score  0 *  Have you tolerated food without any problems? Yes.    Have you been able to return to your normal activities? Yes.    Do you have any questions about your discharge instructions: Diet   No. Medications  No. Follow up visit  No.  Do you have questions or concerns about your Care? No.  Actions: * If pain score is 4 or above: No action needed, pain <4.    1. Have you developed a fever since your procedure? no  2.   Have you had an respiratory symptoms (SOB or cough) since your procedure? no  3.   Have you tested positive for COVID 19 since your procedure no  4.   Have you had any family members/close contacts diagnosed with the COVID 19 since your procedure?  no   If yes to any of these questions please route to Laverna Peace, RN and Karlton Lemon, RN

## 2020-04-23 ENCOUNTER — Ambulatory Visit: Payer: Medicaid Other | Admitting: Internal Medicine

## 2020-05-05 ENCOUNTER — Telehealth: Payer: Self-pay | Admitting: Internal Medicine

## 2020-05-05 NOTE — Telephone Encounter (Signed)
Pt is requesting a call back from a nurse to discuss what follow up she needs since the procedure she had.

## 2020-05-06 NOTE — Telephone Encounter (Signed)
See my chart message

## 2020-05-06 NOTE — Telephone Encounter (Signed)
Left message for patient to call back  

## 2020-05-06 NOTE — Telephone Encounter (Signed)
Inbound call from patient returning your call. 

## 2020-08-13 IMAGING — CT CT ABD-PELV W/ CM
2 of 4 series · 16 of 46 positions shown, 18 images · IV contrast (omnipaque)
Comparison: December 29, 2017

CLINICAL DATA: Epigastric pain.

EXAM:
CT ABDOMEN AND PELVIS WITH CONTRAST
TECHNIQUE: Multidetector CT imaging of the abdomen and pelvis was performed
using the standard protocol following bolus administration of
intravenous contrast.
CONTRAST:  100mL OMNIPAQUE IOHEXOL 300 MG/ML  SOLN

[Series 2: axial st · axial · 0.61mm/px · z∈[-622,-252]mm · 13 of 82 slices shown, 15 images]
[im 4/82  soft-tissue]
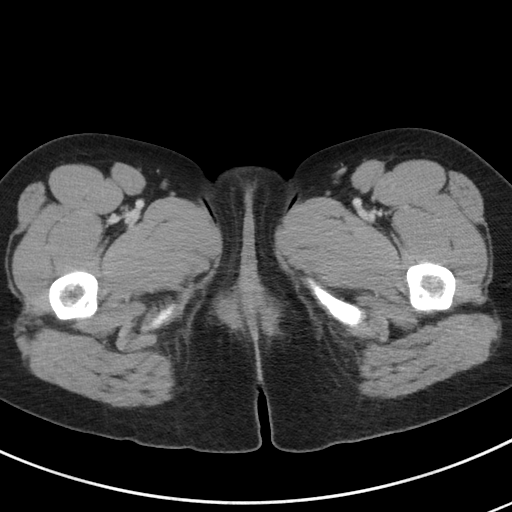
[im 4/82  bone]
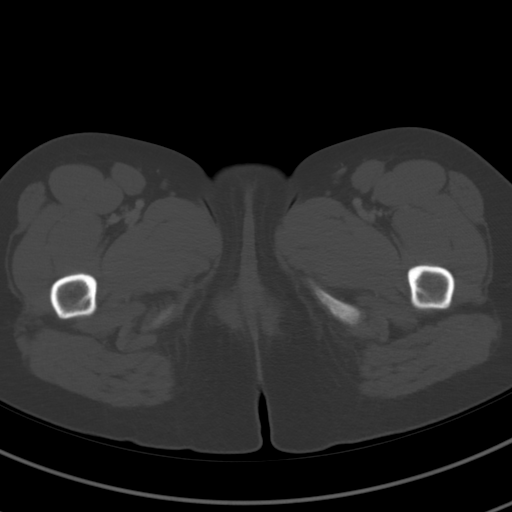
[im 10/82  soft-tissue]
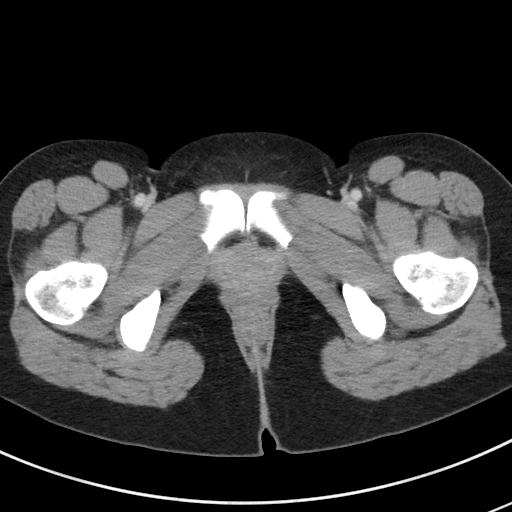
[im 17/82  soft-tissue]
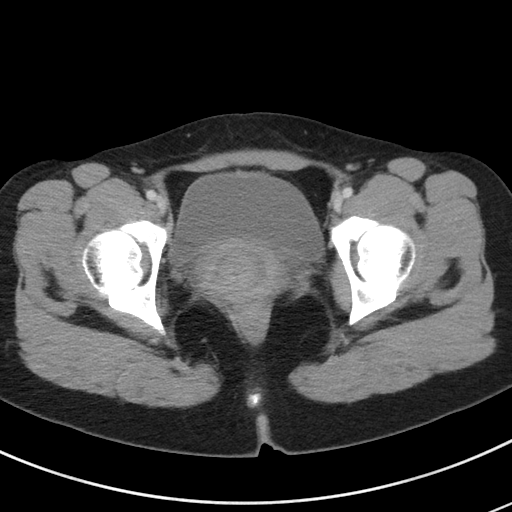
[im 23/82  soft-tissue]
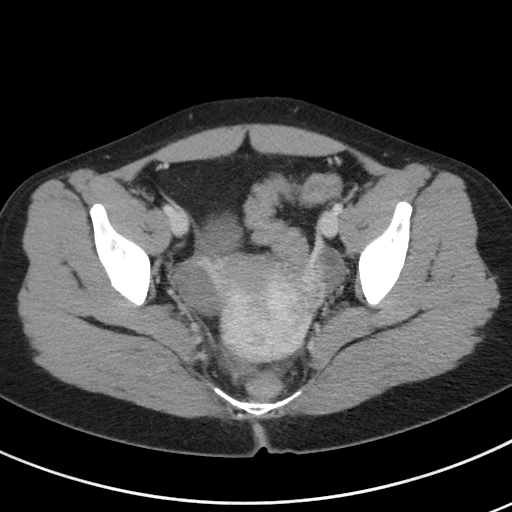
[im 30/82  soft-tissue]
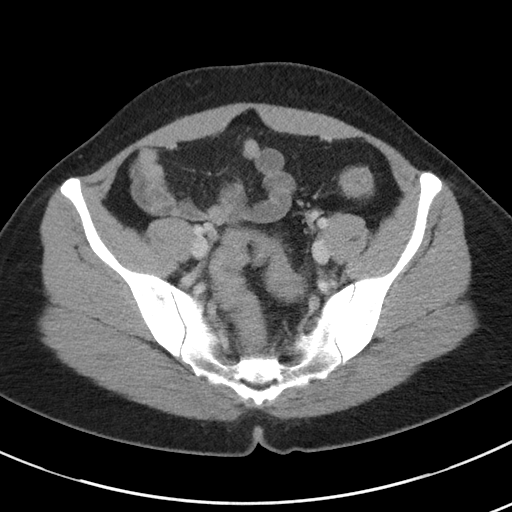
[im 36/82  soft-tissue]
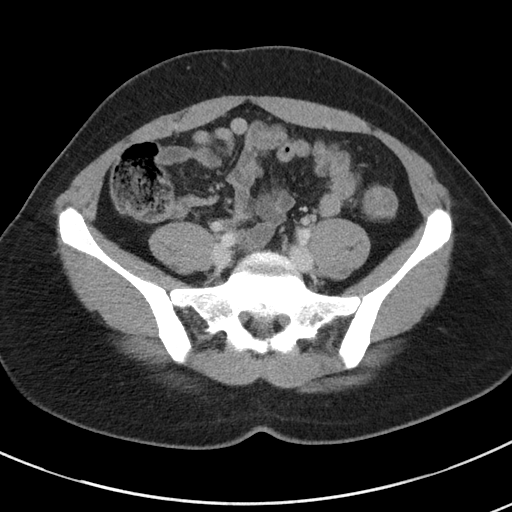
[im 43/82  soft-tissue]
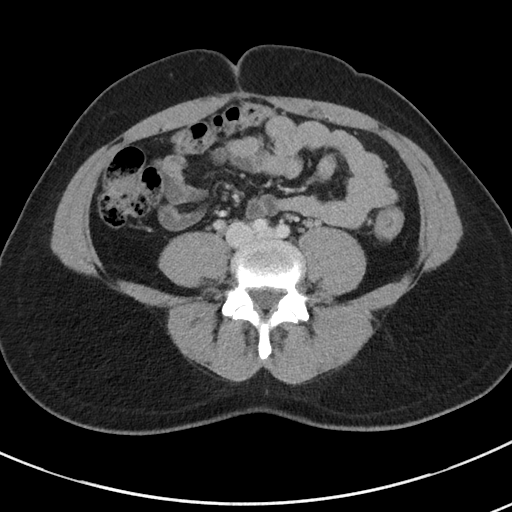
[im 46/82  soft-tissue]
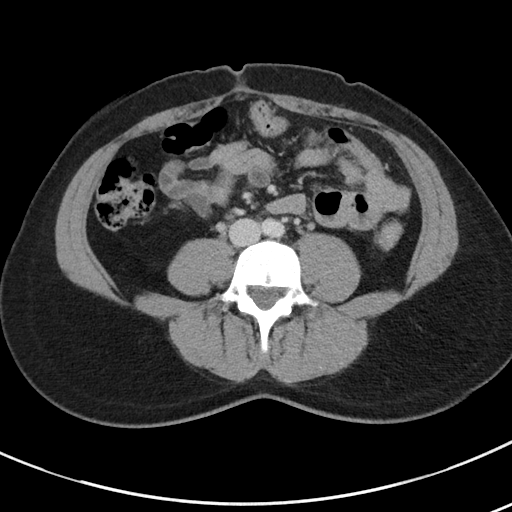
[im 52/82  soft-tissue]
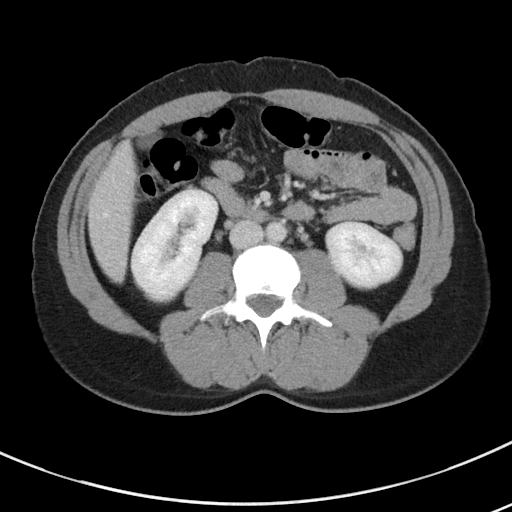
[im 52/82  bone]
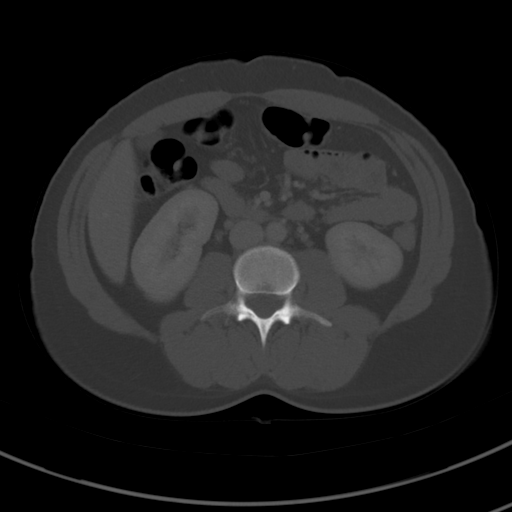
[im 59/82  soft-tissue]
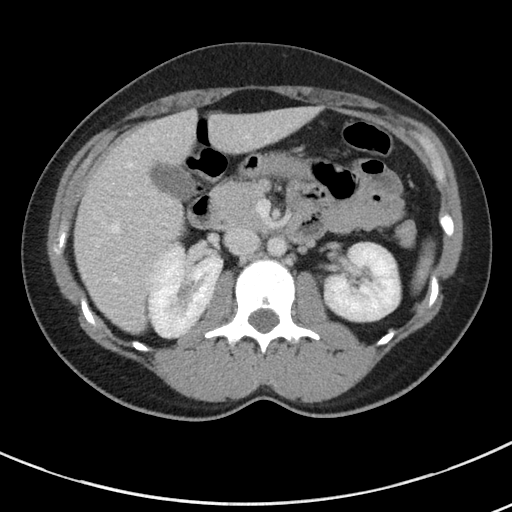
[im 65/82  soft-tissue]
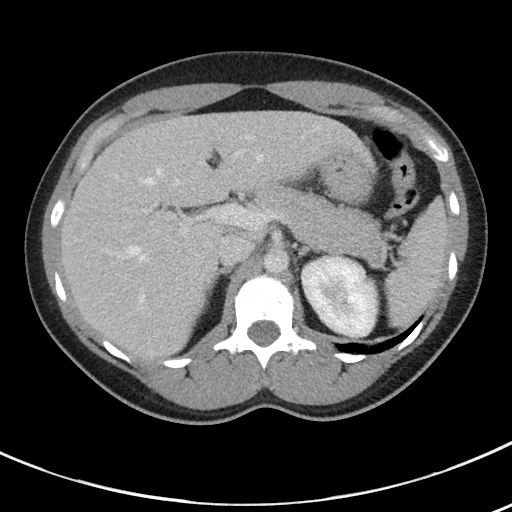
[im 72/82  soft-tissue]
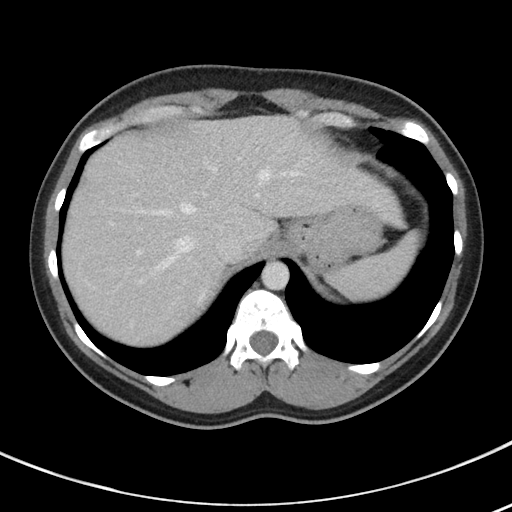
[im 78/82  soft-tissue]
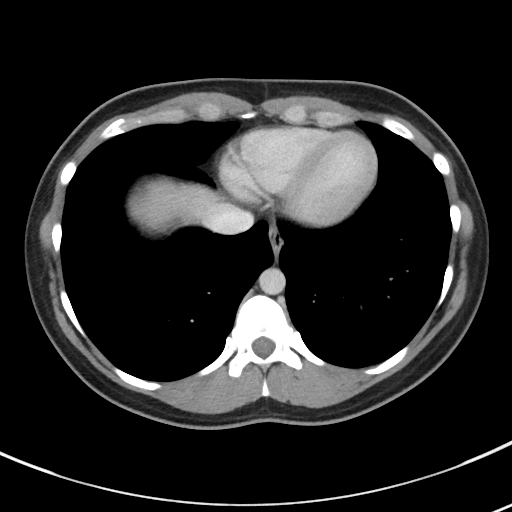

[Series 5: coronal st · coronal · 0.64mm/px · 3 of 94 slices shown]
[im 32/94  soft-tissue]
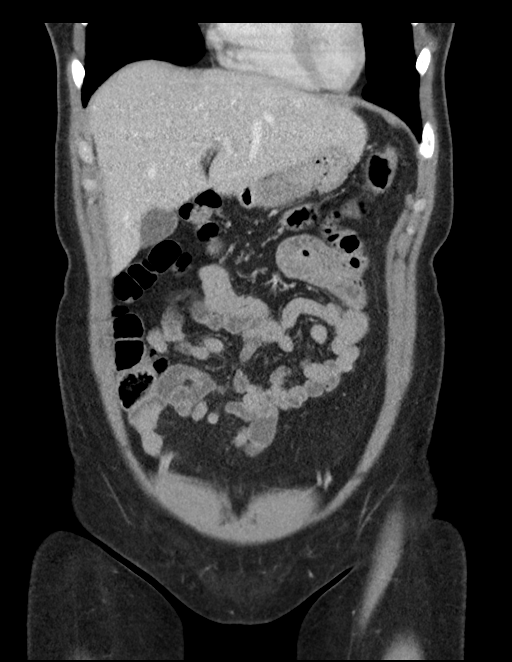
[im 42/94  soft-tissue]
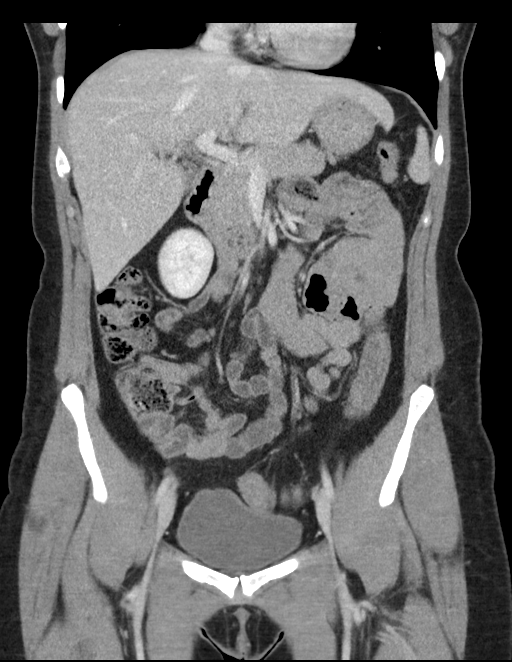
[im 52/94  soft-tissue]
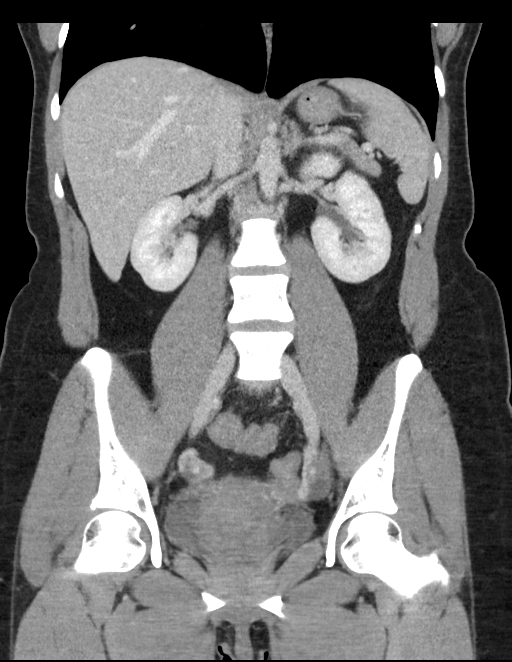

[16 of 46 positions shown; findings below may reference images not displayed]

FINDINGS: Lower chest: No acute abnormality.

Hepatobiliary: No focal liver abnormality is seen. No gallstones,
gallbladder wall thickening, or biliary dilatation.

Pancreas: Unremarkable. No pancreatic ductal dilatation or
surrounding inflammatory changes.

Spleen: Normal in size without focal abnormality.

Adrenals/Urinary Tract: Adrenal glands are unremarkable. Kidneys are
normal, without renal calculi, focal lesion, or hydronephrosis.
Bladder is unremarkable.

Stomach/Bowel: Stomach is within normal limits. Appendix appears
normal. No evidence of bowel dilatation. There is mild thickening of
the distal descending colon and proximal sigmoid colon which are
both collapsed.

Vascular/Lymphatic: No significant vascular findings are present. No
enlarged abdominal or pelvic lymph nodes.

Reproductive: The uterus is normal in appearance. A 1.3 cm cyst is
seen within the anterior aspect of the left adnexa.

Other: No abdominal wall hernia or abnormality. No abdominopelvic
ascites.

Musculoskeletal: No acute or significant osseous findings.
IMPRESSION: 1. Small left adnexal cyst, likely ovarian in origin.
2. Mild thickening of the distal descending and proximal sigmoid
colon which is likely due to their collapsed appearance. Early
colitis cannot completely be excluded.

## 2020-08-18 ENCOUNTER — Ambulatory Visit: Payer: Medicaid Other | Admitting: Internal Medicine

## 2020-10-17 ENCOUNTER — Ambulatory Visit: Payer: Medicaid Other | Admitting: Internal Medicine

## 2020-12-26 ENCOUNTER — Ambulatory Visit: Payer: Medicaid Other | Admitting: Internal Medicine

## 2021-01-06 ENCOUNTER — Telehealth: Payer: Self-pay

## 2021-01-07 NOTE — Telephone Encounter (Signed)
Per Dr Leone Payor after multiple no shows a discharge letter has been initiated.

## 2021-01-12 ENCOUNTER — Telehealth: Payer: Self-pay | Admitting: Internal Medicine

## 2021-01-12 NOTE — Telephone Encounter (Signed)
Patient dismissed from [facility] by Stan Head, MD, effective 01/06/21. Dismissal Letter sent out by 1st class mail. [employer]

## 2021-03-03 ENCOUNTER — Emergency Department (HOSPITAL_BASED_OUTPATIENT_CLINIC_OR_DEPARTMENT_OTHER)
Admission: EM | Admit: 2021-03-03 | Discharge: 2021-03-03 | Disposition: A | Discharge status: Home / Self Care | Payer: Medicaid Other | Attending: Student | Admitting: Student

## 2021-03-03 ENCOUNTER — Other Ambulatory Visit: Payer: Self-pay

## 2021-03-03 ENCOUNTER — Encounter (HOSPITAL_BASED_OUTPATIENT_CLINIC_OR_DEPARTMENT_OTHER): Payer: Self-pay | Admitting: Emergency Medicine

## 2021-03-03 DIAGNOSIS — J020 Streptococcal pharyngitis: Secondary | ICD-10-CM | POA: Diagnosis not present

## 2021-03-03 DIAGNOSIS — Z20822 Contact with and (suspected) exposure to covid-19: Secondary | ICD-10-CM | POA: Insufficient documentation

## 2021-03-03 DIAGNOSIS — J029 Acute pharyngitis, unspecified: Secondary | ICD-10-CM | POA: Diagnosis present

## 2021-03-03 LAB — RESP PANEL BY RT-PCR (FLU A&B, COVID) ARPGX2
Influenza A by PCR: NEGATIVE
Influenza B by PCR: NEGATIVE
SARS Coronavirus 2 by RT PCR: NEGATIVE

## 2021-03-03 LAB — GROUP A STREP BY PCR: Group A Strep by PCR: DETECTED — AB

## 2021-03-03 MED ORDER — PENICILLIN G BENZATHINE 1200000 UNIT/2ML IM SUSY
1.2000 10*6.[IU] | PREFILLED_SYRINGE | Freq: Once | INTRAMUSCULAR | Status: AC
  Administered 2021-03-03: 1.2 10*6.[IU] via INTRAMUSCULAR
  Filled 2021-03-03: qty 2

## 2021-03-03 NOTE — ED Triage Notes (Signed)
Pt arrives to ED with c/o of sore throat, headache, fever, body aches for x2 days. No CP or SOB.

## 2021-03-03 NOTE — ED Provider Notes (Signed)
[facility] EMERGENCY DEPT Provider Note   CSN: [id number] Arrival date & time: 03/03/21  1608     History Chief Complaint  Patient presents with   Sore Throat   Fever    Catherine Hamilton is a 30 y.o. female who presents the emergency department for evaluation of sore throat, headache, fever and body aches for the last 48 hours.  Denies chest pain, shortness of breath, vomiting, diarrhea or other systemic symptoms.  She arrives febrile to 101.4.  No known sick contacts.   Sore Throat Pertinent negatives include no chest pain, no abdominal pain and no shortness of breath.  Fever Associated symptoms: chills and sore throat   Associated symptoms: no chest pain, no cough, no dysuria, no ear pain, no rash and no vomiting       Past Medical History:  Diagnosis Date   Allergy    ANEMIA, IRON DEFICIENCY 09/06/2006   Qualifier: Diagnosis of  By: Senaida Ores CMA,, JEANNETTE     Anxiety    Eosinophilic esophagitis    Hx of seizure disorder    AS A CHILD   Interstitial cystitis    Post partum depression 11/14/2017    Patient Active Problem List   Diagnosis Date Noted   Periumbilical pain 06/01/2019   Epigastric pain 12/20/2017   Bloating 12/20/2017   Foot pain, bilateral 11/14/2017   Uses birth control 11/14/2017   Plantar fasciitis, bilateral 11/14/2017   Frequent UTI 11/28/2013   Contraception management 08/01/2013    Past Surgical History:  Procedure Laterality Date   COLONOSCOPY  06/2019   CYSTOSCOPY W/ DILATION OF BLADDER       OB History     Gravida  2   Para  2   Term  2   Preterm      AB      Living  2      SAB      IAB      Ectopic      Multiple  0   Live Births  2           Family History  Problem Relation Age of Onset   Hypertension Mother    Other Mother        spinal tumor-ependymoma   Hypertension Maternal Grandmother    Colon cancer Neg Hx    Esophageal cancer Neg Hx    Rectal cancer Neg Hx    Stomach cancer  Neg Hx     Social History   Tobacco Use   Smoking status: Never   Smokeless tobacco: Never  Vaping Use   Vaping Use: Never used  Substance Use Topics   Alcohol use: Not Currently   Drug use: No    Home Medications Prior to Admission medications   Medication Sig Start Date End Date Taking? Authorizing Provider  b complex vitamins capsule Take by mouth.    [provider]  cephALEXin (KEFLEX) 500 MG capsule Take 500 mg by mouth 2 (two) times daily. 04/16/20   [provider]  Omeprazole Magnesium (PRILOSEC OTC PO) Take by mouth. Patient not taking: Reported on 04/18/2020    [provider]    Allergies    Adhesive [tape], Other, and Scopolamine  Review of Systems   Review of Systems  Constitutional:  Positive for chills and fever.  HENT:  Positive for sore throat. Negative for ear pain.   Eyes:  Negative for pain and visual disturbance.  Respiratory:  Negative for cough and shortness of breath.  Cardiovascular:  Negative for chest pain and palpitations.  Gastrointestinal:  Negative for abdominal pain and vomiting.  Genitourinary:  Negative for dysuria and hematuria.  Musculoskeletal:  Negative for arthralgias and back pain.  Skin:  Negative for color change and rash.  Neurological:  Negative for seizures and syncope.  All other systems reviewed and are negative.  Physical Exam Updated Vital Signs BP 124/70 (BP Location: Right Arm)   Pulse (!) 107   Temp (!) 101.4 F (38.6 C)   Resp 14   Ht 5' (1.524 m)   Wt 59 kg   SpO2 97%   BMI 25.39 kg/m   Physical Exam Vitals and nursing note reviewed.  Constitutional:      General: She is not in acute distress.    Appearance: She is well-developed.  HENT:     Head: Normocephalic and atraumatic.     Mouth/Throat:     Pharynx: Oropharyngeal exudate present.     Tonsils: Tonsillar exudate present. 2+ on the right. 2+ on the left.  Eyes:     Conjunctiva/sclera: Conjunctivae normal.   Cardiovascular:     Rate and Rhythm: Normal rate and regular rhythm.     Heart sounds: No murmur heard. Pulmonary:     Effort: Pulmonary effort is normal. No respiratory distress.     Breath sounds: Normal breath sounds.  Abdominal:     Palpations: Abdomen is soft.     Tenderness: There is no abdominal tenderness.  Musculoskeletal:     Cervical back: Neck supple.  Skin:    General: Skin is warm and dry.  Neurological:     Mental Status: She is alert.    ED Results / Procedures / Treatments   Labs (all labs ordered are listed, but only abnormal results are displayed) Labs Reviewed  GROUP A STREP BY PCR - Abnormal; Notable for the following components:      Result Value   Group A Strep by PCR DETECTED (*)    All other components within normal limits  RESP PANEL BY RT-PCR (FLU A&B, COVID) [id number]    EKG None  Radiology No results found.  Procedures Procedures   Medications Ordered in ED Medications  penicillin g benzathine (BICILLIN LA) 1200000 UNIT/2ML injection 1.2 Million Units (1.2 Million Units Intramuscular Given 03/03/21 1829)    ED Course  I have reviewed the triage vital signs and the nursing notes.  Pertinent labs & imaging results that were available during my care of the patient were reviewed by me and considered in my medical decision making (see chart for details).    MDM Rules/Calculators/A&P                           Patient seen in the emergency department for evaluation of sore throat and fever.  Physical exam reveals bilateral tonsillar swelling with exudate and oropharyngeal erythema.  COVID and flu negative.  Strep positive.  Patient given intramuscular Bicillin.  Patient able to tolerate p.o. without difficulty and does not need steroids at this time.  Patient then discharged. Final Clinical Impression(s) / ED Diagnoses Final diagnoses:  Strep throat    Rx / DC Orders ED Discharge Orders     None        Johnpaul Gillentine, Debe Coder, MD 03/03/21  2047
